# Patient Record
Sex: Female | Born: 1965 | Race: White | Hispanic: No | Marital: Married | Smoking: Never smoker
Health system: Southern US, Community
[De-identification: ages and names within clinical notes are randomized; demographics above are authoritative.]

## PROBLEM LIST (undated history)

## (undated) DIAGNOSIS — M1990 Unspecified osteoarthritis, unspecified site: Secondary | ICD-10-CM

## (undated) DIAGNOSIS — G4700 Insomnia, unspecified: Secondary | ICD-10-CM

## (undated) DIAGNOSIS — D259 Leiomyoma of uterus, unspecified: Secondary | ICD-10-CM

## (undated) DIAGNOSIS — D751 Secondary polycythemia: Secondary | ICD-10-CM

## (undated) DIAGNOSIS — J302 Other seasonal allergic rhinitis: Secondary | ICD-10-CM

## (undated) DIAGNOSIS — Z973 Presence of spectacles and contact lenses: Secondary | ICD-10-CM

## (undated) DIAGNOSIS — N950 Postmenopausal bleeding: Secondary | ICD-10-CM

## (undated) HISTORY — PX: HYSTEROSCOPY W/ ENDOMETRIAL ABLATION: SUR665

## (undated) HISTORY — PX: COLONOSCOPY: SHX174

---

## 1998-08-10 ENCOUNTER — Other Ambulatory Visit: Admission: RE | Admit: 1998-08-10 | Discharge: 1998-08-10 | Payer: Self-pay | Admitting: Obstetrics and Gynecology

## 2001-01-21 ENCOUNTER — Other Ambulatory Visit: Admission: RE | Admit: 2001-01-21 | Discharge: 2001-01-21 | Payer: Self-pay | Admitting: Obstetrics and Gynecology

## 2002-09-22 ENCOUNTER — Other Ambulatory Visit: Admission: RE | Admit: 2002-09-22 | Discharge: 2002-09-22 | Payer: Self-pay | Admitting: Obstetrics and Gynecology

## 2005-06-26 ENCOUNTER — Other Ambulatory Visit: Admission: RE | Admit: 2005-06-26 | Discharge: 2005-06-26 | Payer: Self-pay | Admitting: Obstetrics and Gynecology

## 2005-12-02 ENCOUNTER — Ambulatory Visit: Payer: Self-pay | Admitting: Internal Medicine

## 2007-05-27 ENCOUNTER — Ambulatory Visit (HOSPITAL_COMMUNITY): Admission: RE | Admit: 2007-05-27 | Discharge: 2007-05-27 | Payer: Self-pay | Admitting: Obstetrics and Gynecology

## 2007-05-27 ENCOUNTER — Encounter (INDEPENDENT_AMBULATORY_CARE_PROVIDER_SITE_OTHER): Payer: Self-pay | Admitting: Obstetrics and Gynecology

## 2007-08-13 DIAGNOSIS — J309 Allergic rhinitis, unspecified: Secondary | ICD-10-CM | POA: Insufficient documentation

## 2007-08-30 ENCOUNTER — Telehealth: Payer: Self-pay | Admitting: Internal Medicine

## 2008-02-07 ENCOUNTER — Telehealth: Payer: Self-pay | Admitting: Internal Medicine

## 2008-02-09 ENCOUNTER — Encounter: Payer: Self-pay | Admitting: Internal Medicine

## 2008-12-27 ENCOUNTER — Telehealth: Payer: Self-pay | Admitting: Internal Medicine

## 2009-06-15 ENCOUNTER — Telehealth: Payer: Self-pay | Admitting: Internal Medicine

## 2009-07-05 ENCOUNTER — Telehealth: Payer: Self-pay | Admitting: Internal Medicine

## 2009-10-03 ENCOUNTER — Ambulatory Visit: Payer: Self-pay | Admitting: Family Medicine

## 2009-10-03 DIAGNOSIS — N390 Urinary tract infection, site not specified: Secondary | ICD-10-CM | POA: Insufficient documentation

## 2009-10-03 LAB — CONVERTED CEMR LAB
Bilirubin Urine: NEGATIVE
Nitrite: NEGATIVE
Urobilinogen, UA: 0.2

## 2009-10-05 ENCOUNTER — Telehealth: Payer: Self-pay | Admitting: Family Medicine

## 2009-12-11 ENCOUNTER — Ambulatory Visit: Payer: Self-pay | Admitting: Internal Medicine

## 2009-12-28 ENCOUNTER — Telehealth: Payer: Self-pay | Admitting: Internal Medicine

## 2010-01-04 ENCOUNTER — Telehealth: Payer: Self-pay | Admitting: Internal Medicine

## 2010-01-15 ENCOUNTER — Encounter: Payer: Self-pay | Admitting: Internal Medicine

## 2010-01-17 ENCOUNTER — Encounter: Payer: Self-pay | Admitting: Internal Medicine

## 2010-01-18 ENCOUNTER — Telehealth: Payer: Self-pay | Admitting: Internal Medicine

## 2010-01-21 ENCOUNTER — Encounter: Payer: Self-pay | Admitting: Internal Medicine

## 2010-01-25 ENCOUNTER — Telehealth: Payer: Self-pay | Admitting: Internal Medicine

## 2010-02-01 ENCOUNTER — Telehealth: Payer: Self-pay

## 2010-02-05 ENCOUNTER — Encounter: Payer: Self-pay | Admitting: Internal Medicine

## 2010-03-25 ENCOUNTER — Telehealth (INDEPENDENT_AMBULATORY_CARE_PROVIDER_SITE_OTHER): Payer: Self-pay | Admitting: *Deleted

## 2010-08-12 ENCOUNTER — Telehealth: Payer: Self-pay | Admitting: Internal Medicine

## 2010-08-13 ENCOUNTER — Telehealth: Payer: Self-pay | Admitting: Internal Medicine

## 2010-10-30 ENCOUNTER — Telehealth: Payer: Self-pay | Admitting: Internal Medicine

## 2010-12-10 ENCOUNTER — Encounter: Payer: Self-pay | Admitting: Internal Medicine

## 2010-12-24 NOTE — Progress Notes (Signed)
  Phone Note From Pharmacy   [id number] Call For: k  Summary of Call: usual dose for Tamiflu is #10 instead of #20??? [phone] [facility] Initial call taken by: Duard Brady LPN,  January 04, 2010 1:10 PM  Follow-up for Phone Call        #10 OK if just for one patient  Follow-up by: Gordy Savers  MD,  January 04, 2010 2:30 PM    Additional Follow-up for Phone Call Additional follow up Details #2::    spoke with Laverta Baltimore , gave instruction to disp. # 10 . Follow-up by: Duard Brady LPN,  January 04, 2010 3:28 PM

## 2010-12-24 NOTE — Assessment & Plan Note (Signed)
Summary: fup on meds//ccm   Vital Signs:  Patient profile:   45 year old female Weight:      188 pounds BP sitting:   130 / 88  (left arm) Cuff size:   regular  Vitals Entered By: Raechel Ache, RN (December 11, 2009 4:10 PM) CC: Med check.   CC:  Med check..  History of Present Illness: 45 year old patient who is seen today for follow-up.  She has a history of allergic rhinitis.  She was seen two months ago for a UTI and has recovered.  She has been followed by dermatology for a scalp dermatitis, which has improved on oral prednisone, as well as topical steroids.  She feels well today without concerns or complaints.  Allergies: No Known Drug Allergies  Past History:  Past Medical History: Reviewed history from 08/13/2007 and no changes required. Allergic rhinitis  Physical Exam  General:  overweight-appearing.  normal blood pressure Head:  Normocephalic and atraumatic without obvious abnormalities. No apparent alopecia or balding. Eyes:  No corneal or conjunctival inflammation noted. EOMI. Perrla. Funduscopic exam benign, without hemorrhages, exudates or papilledema. Vision grossly normal. Ears:  External ear exam shows no significant lesions or deformities.  Otoscopic examination reveals clear canals, tympanic membranes are intact bilaterally without bulging, retraction, inflammation or discharge. Hearing is grossly normal bilaterally. Mouth:  Oral mucosa and oropharynx without lesions or exudates.  Teeth in good repair. Neck:  No deformities, masses, or tenderness noted. Lungs:  Normal respiratory effort, chest expands symmetrically. Lungs are clear to auscultation, no crackles or wheezes. Heart:  Normal rate and regular rhythm. S1 and S2 normal without gallop, murmur, click, rub or other extra sounds. Abdomen:  Bowel sounds positive,abdomen soft and non-tender without masses, organomegaly or hernias noted.   Impression & Recommendations:  Problem # 1:  ALLERGIC RHINITIS  (ICD-477.9)  Her updated medication list for this problem includes:    Nasonex 50 Mcg/act Susp (Mometasone furoate) .Marland Kitchen... As directed  Problem # 2:  UTI (ICD-599.0)  Her updated medication list for this problem includes:    Nitrofurantoin Macrocrystal 100 Mg Caps (Nitrofurantoin macrocrystal) ..... One by mouth two times a day for 3 days    Ciprofloxacin Hcl 250 Mg Tabs (Ciprofloxacin hcl) ..... One by mouth two times a day for 5 days  Complete Medication List: 1)  Nasonex 50 Mcg/act Susp (Mometasone furoate) .... As directed 2)  Lexapro 10 Mg Tabs (Escitalopram oxalate) .... Take 1 tablet by mouth once a day 3)  [name] 3 Mg Tabs (Eszopiclone) .... As needed 4)  Nitrofurantoin Macrocrystal 100 Mg Caps (Nitrofurantoin macrocrystal) .... One by mouth two times a day for 3 days 5)  Ciprofloxacin Hcl 250 Mg Tabs (Ciprofloxacin hcl) .... One by mouth two times a day for 5 days  Patient Instructions: 1)  Please schedule a follow-up appointment as needed. 2)  It is important that you exercise regularly at least 20 minutes 5 times a week. If you develop chest pain, have severe difficulty breathing, or feel very tired , stop exercising immediately and seek medical attention. 3)  You need to lose weight. Consider a lower calorie diet and regular exercise.  Prescriptions: [name] 3 MG  TABS (ESZOPICLONE) as needed  #90 x 3   Entered and Authorized by:   Gordy Savers  MD   Signed by:   Gordy Savers  MD on 12/11/2009   Method used:   Print then Give to Patient   RxID:   [id number] [id number]  50 MCG/ACT  SUSP (MOMETASONE FUROATE) as directed  #3 x 5   Entered and Authorized by:   Gordy Savers  MD   Signed by:   Gordy Savers  MD on 12/11/2009   Method used:   Print then Give to Patient   RxID:   [id number]

## 2010-12-24 NOTE — Progress Notes (Signed)
Summary: tamiflu  Call back at [phone]   Summary of Call: Son diagnosed with flu B & handout they gave recommends she get Tamiflu to use.  She & he had the flu shot.  [facility]  NKDA. Initial call taken by: Rudy Jew, RN,  January 04, 2010 11:52 AM    New/Updated Medications: TAMIFLU 75 MG CAPS (OSELTAMIVIR PHOSPHATE) One bid Prescriptions: TAMIFLU 75 MG CAPS (OSELTAMIVIR PHOSPHATE) One bid  #20 x 0   Entered by:   Rudy Jew, RN   Authorized by:   Gordy Savers  MD   Signed by:   Rudy Jew, RN on 01/04/2010   Method used:   Electronically to        [facility] [phone]* (retail)       [address]       [place], [place]  [address]       Ph: [phone] or [phone]       Fax: [phone]   RxID:   [id number]  Tamiflu 75  #20 one twice daily

## 2010-12-24 NOTE — Miscellaneous (Signed)
Summary: lunesta mg change  Clinical Lists Changes  Medications: Changed medication from [name] 3 MG  TABS (ESZOPICLONE) as needed to * [name] 2 MG  TABS (ESZOPICLONE) as needed

## 2010-12-24 NOTE — Medication Information (Signed)
Summary: Coverage Approval for [name]  Coverage Approval for [name]   Imported By: Maryln Gottron 02/06/2010 15:25:56  _____________________________________________________________________  External Attachment:    Type:   Image     Comment:   External Document

## 2010-12-24 NOTE — Progress Notes (Signed)
Summary: decrease to 2 mg  Phone Note Call from Patient   Caller: Patient Call For: Gordy Savers  MD Summary of Call: pt would like to decrease to lunesta 2 mg [facility] Initial call taken by: Heron Sabins,  October 30, 2010 12:03 PM  Follow-up for Phone Call        ok  Follow-up by: Gordy Savers  MD,  October 30, 2010 12:41 PM  Additional Follow-up for Phone Call Additional follow up Details #1::        change to med list - [facility] - [facility]'d 3mg  and gave new rx for 2mg . KIK Additional Follow-up by: Duard Brady LPN,  October 31, 2010 11:59 AM    New/Updated Medications: [name] 2 MG TABS (ESZOPICLONE) one at bedtime as needed for sleep Prescriptions: [name] 2 MG TABS (ESZOPICLONE) one at bedtime as needed for sleep  #30 x 1   Entered by:   Duard Brady LPN   Authorized by:   Gordy Savers  MD   Signed by:   Duard Brady LPN on 11/26/7251   Method used:   Historical   RxID:   [id number]

## 2010-12-24 NOTE — Progress Notes (Signed)
Summary: Call-A-Nurse Report    Call-A-Nurse Triage Call Report Triage Record Num: [id number] Operator: Jaci Carrel Patient Name: Robin Bowman Call Date & Time: 03/23/2010 1:26:39PM Patient Phone: [phone] PCP: Gordy Savers Patient Gender: Female PCP Fax : [phone] Patient DOB: 10-18-1966 Practice Name: [facility] Reason for Call: LMP- 4.30. Pt states that she thinks she has UTI. Afebrile. Onset 4.30- sx: burning, frequency, urgency. Pt states that she does have some back pain- but this is normal for her and her cycle just started. Advised UC. Pt will go for eval. Protocol(s) Used: Urinary Symptoms - Female Recommended Outcome per Protocol: See Provider within 24 hours Reason for Outcome: Urinary tract symptoms Care Advice:  ~ 03/23/2010 1:38:47PM Page 1 of 1 CAN_[id number]_V2

## 2010-12-24 NOTE — Progress Notes (Signed)
Summary: lunesta 2 mg has been approved/pt stated she needs 3mg    Phone Note Call from Patient Call back at Home Phone [phone]   Caller: Patient Call For: Gordy Savers  MD Complaint: Urinary/GYN Problems Action Taken: Patient advised to call 911 Summary of Call: pt stated she has been on lunesta 3 mg not 2 mg, Prior Authorization has been approved for 2 mg. Pt has rx for 3 mg. Please advise  Initial call taken by: Heron Sabins,  January 25, 2010 9:49 AM  Follow-up for Phone Call        OK to try 2 mg or can change dose  to 3 mg Follow-up by: Gordy Savers  MD,  January 25, 2010 12:48 PM  Additional Follow-up for Phone Call Additional follow up Details #1::        please do pre auth for 3 mg [name]  Additional Follow-up by: Duard Brady LPN,  January 25, 2010 1:30 PM    Additional Follow-up for Phone Call Additional follow up Details #2::    Ppt called again...checking on stauts. Please return her call today. Pt needs to call insurance company and let them send a new form.  Or, since her present prescription is for 2 mg, she will most likely need a new prescription from Dr. Kirtland Bouchard with new strength and directions to send to mail order. Lynann Beaver CMA  January 25, 2010 2:25 PM I left a message, but we will need the prescription. Follow-up by: Warnell Forester,  January 25, 2010 2:21 PM  New/Updated Medications: * [name] 3 MG  TABS (ESZOPICLONE) as needed Prescriptions: [name] 3 MG  TABS (ESZOPICLONE) as needed  #90 x 2   Entered by:   Duard Brady LPN   Authorized by:   Gordy Savers  MD   Signed by:   Duard Brady LPN on 13/24/4010   Method used:   Printed then faxed to ...       [facility]* (mail-order)             ,          Ph: [phone]       Fax: [phone]   RxID:   [id number]   Appended Document: lunesta 2 mg has been approved/pt stated she needs 3mg   spoke with Joe at [facility] call center - per him - we do not  need to do another PA - just needs new rx for 3mg  - I attempt to call pt to make them aware - cell # - voice mail - LMTCB if questions , informed of above.

## 2010-12-24 NOTE — Progress Notes (Signed)
Summary: MEDICATION RX TO [facility]?  Phone Note Call from Patient   Caller: Patient [phone] Reason for Call: Talk to Nurse, Talk to Doctor Summary of Call: Pt called and adv that she needs to have RX for her meds ([name] 3 MG  TABS (ESZOPICLONE)   -and-  (NASONEX 50 MCG/ACT  SUSP (MOMETASONE FUROATE)  X 90 days  to be faxed into her mail order pharmacy [facility])..... Pt states that she received a copy of her written RX but would rather they be faxed to [facility] fax # [phone].  instead of her having to mail them.  Pt can be reached @ [phone] with any questions or concerns.  Initial call taken by: Debbra Riding,  December 28, 2009 10:18 AM    Prescriptions: [name] 3 MG  TABS (ESZOPICLONE) as needed  #90 x 3   Entered by:   Raechel Ache, RN   Authorized by:   Gordy Savers  MD   Signed by:   Raechel Ache, RN on 12/28/2009   Method used:   Printed then faxed to ...       [facility]* (mail-order)             ,          Ph: [phone]       Fax: [phone]   RxID:   [id number] NASONEX 50 MCG/ACT  SUSP (MOMETASONE FUROATE) as directed  #3 x 5   Entered by:   Raechel Ache, RN   Authorized by:   Gordy Savers  MD   Signed by:   Raechel Ache, RN on 12/28/2009   Method used:   Printed then faxed to ...       [facility]* (mail-order)             ,          Ph: [phone]       Fax: [phone]   RxID:   [id number]

## 2010-12-24 NOTE — Progress Notes (Signed)
Summary: please return call - ps on lunesta 3 mg done - rx faxed  Phone Note Call from Patient Call back at Work Phone [phone]   Caller: Patient--live call Reason for Call: Talk to Nurse Summary of Call: Wants Kim to return call. Initial call taken by: Warnell Forester,  February 01, 2010 8:52 AM    Prescriptions: [name] 3 MG  TABS (ESZOPICLONE) as needed  #14 x 1   Entered by:   Duard Brady LPN   Authorized by:   Gordy Savers  MD   Signed by:   Duard Brady LPN on 09/81/1914   Method used:   F[facility]ed to ...       [facility] [phone]* (retail)       [address]       [place], [place]  [address]       Ph: [phone] or [phone]       Fax: [phone]   RxID:   [id number]  spoke with Riki Rusk at [facility] [phone] - did pre auth for [name] 3mg  , approved 02/01/10 to 02/02/11 , no auth # given , when filled pharm will see that it has been approved. Pt. aware. lunesta 3 mg [facility] and to [facility]

## 2010-12-24 NOTE — Progress Notes (Signed)
Summary: Pt req refill of [name] 3mg  to [facility] [address]   Phone Note Refill Request Call back at Work Phone [phone] Message from:  Patient on August 12, 2010 11:52 AM  Refills Requested: Medication #1:  [name] 3 MG  TABS (ESZOPICLONE) as needed   Dosage confirmed as above?Dosage Confirmed   Brand Name Necessary? No Pls call in script to [facility] [address]            Method Requested: Telephone to Pharmacy Initial call taken by: Lucy Antigua,  August 12, 2010 11:53 AM    Prescriptions: Alfonso Patten 3 MG  TABS (ESZOPICLONE) as needed  #30 x 1   Entered by:   Duard Brady LPN   Authorized by:   Gordy Savers  MD   Signed by:   Duard Brady LPN on 86/57/8469   Method used:   Historical   RxID:   [id number]

## 2010-12-24 NOTE — Progress Notes (Signed)
Summary: needs rx-faxed 2wk supply  Phone Note Call from Patient Call back at Work Phone [phone]   Caller: Patient-live call Summary of Call: need a 2 week suplly of [name] until her mail order pharmacy arrives. [facility] Call pt with the status. Initial call taken by: Warnell Forester,  January 18, 2010 12:46 PM    Prescriptions: [name] 2 MG  TABS (ESZOPICLONE) as needed  #14 x 1   Entered by:   Duard Brady LPN   Authorized by:   Gordy Savers  MD   Signed by:   Duard Brady LPN on 13/06/6577   Method used:   Faxed to ...       [facility] [address] (retail)       [address]       [place], [place]  [address]       Ph: [phone] or [phone]       Fax: [phone]   RxID:   [id number]

## 2010-12-24 NOTE — Medication Information (Signed)
Summary: [name] Approved/BCBS  [name] Approved/BCBS   Imported By: Sherian Rein 01/24/2010 12:03:13  _____________________________________________________________________  External Attachment:    Type:   Image     Comment:   External Document

## 2010-12-24 NOTE — Progress Notes (Signed)
Summary: refill lunesta - [facility]  Phone Note Refill Request Message from:  Fax from Pharmacy on August 13, 2010 10:28 AM  Refills Requested: Medication #1:  [name] 3 MG  TABS (ESZOPICLONE) as needed [facility]    Method Requested: Fax to Local Pharmacy Initial call taken by: Duard Brady LPN,  August 13, 2010 10:28 AM    Prescriptions: [name] 3 MG  TABS (ESZOPICLONE) as needed  #30 x 6   Entered by:   Duard Brady LPN   Authorized by:   Gordy Savers  MD   Signed by:   Duard Brady LPN on 29/56/2130   Method used:   Historical   RxID:   [id number]  faxed to [facility]. - also had [facility] with 1 refill yesterday     KIK

## 2010-12-24 NOTE — Medication Information (Signed)
Summary: Prior Authorization Request for [facility]  Prior Authorization Request for [name]   Imported By: Maryln Gottron 01/22/2010 09:24:22  _____________________________________________________________________  External Attachment:    Type:   Image     Comment:   External Document

## 2010-12-26 NOTE — Medication Information (Signed)
Summary: Alfonso Patten Approved  [name] Approved   Imported By: Maryln Gottron 12/12/2010 13:29:05  _____________________________________________________________________  External Attachment:    Type:   Image     Comment:   External Document

## 2010-12-31 ENCOUNTER — Other Ambulatory Visit: Payer: Self-pay | Admitting: Internal Medicine

## 2010-12-31 DIAGNOSIS — G4700 Insomnia, unspecified: Secondary | ICD-10-CM

## 2010-12-31 MED ORDER — ESZOPICLONE 2 MG PO TABS: 2.0000 mg | ORAL_TABLET | Freq: Every evening | ORAL | Status: DC | PRN

## 2010-12-31 NOTE — Telephone Encounter (Signed)
[facility] 

## 2011-02-21 ENCOUNTER — Other Ambulatory Visit: Payer: Self-pay | Admitting: Internal Medicine

## 2011-02-21 MED ORDER — MOMETASONE FUROATE 50 MCG/ACT NA SUSP: 2.0000 | Freq: Every day | NASAL | Status: DC

## 2011-02-21 NOTE — Telephone Encounter (Signed)
Pt called and said she normally get Nasonex Spray through mail order pharmacy, but she would like this med to [facility] called [facility] [address] asap.

## 2011-03-31 ENCOUNTER — Other Ambulatory Visit: Payer: Self-pay

## 2011-03-31 DIAGNOSIS — G4700 Insomnia, unspecified: Secondary | ICD-10-CM

## 2011-03-31 MED ORDER — ESZOPICLONE 2 MG PO TABS: 2.0000 mg | ORAL_TABLET | Freq: Every evening | ORAL | Status: DC | PRN

## 2011-04-08 NOTE — Op Note (Signed)
Robin, Bowman                 ACCOUNT NO.:  [id number]   MEDICAL RECORD NO.:  [id number]          PATIENT TYPE:  AMB   LOCATION:  SDC                           FACILITY:  [facility]   PHYSICIAN:  Miguel Aschoff, M.D.       DATE OF BIRTH:  06/02/1966   DATE OF PROCEDURE:  05/27/2007  DATE OF DISCHARGE:                               OPERATIVE REPORT   PREOPERATIVE DIAGNOSIS:  Menorrhagia.   POSTOPERATIVE DIAGNOSIS:  Menorrhagia.   PROCEDURE:  Cervical dilatation, hysteroscopy, uterine curettage,  NovaSure endometrial ablation.   SURGEON:  Dr. Miguel Aschoff   ANESTHESIA:  General.   COMPLICATIONS:  None.   JUSTIFICATION:  The patient is a 45 year old white female with history  of progressive menorrhagia not controlled with medical therapy.  Because  of degree of bleeding.  She presents now to undergo D&C hysteroscopy and  endometrial ablation effort to both diagnose and control the heavy  menses.  The risks and benefits of this procedure were discussed with  the patient and informed consent has been obtained.   PROCEDURE:  The patient was taken to the operating placed in the supine  position.  General anesthesia was administered without difficulty.  Placed in dorsal lithotomy position, prepped and draped in usual sterile  fashion.  Bladder was catheterized.  Under anesthesia examination was  carried out.  This revealed normal external genitalia normal Bartholin's  and Skene's glands and normal urethra. Vaginal wall was without gross  lesion.  The cervix was without gross lesion.  The uterus to be  globular, top normal size.  No adnexal masses were noted. At this point  the uterus was sounded 8 cm and cervical length of 3.5 cm was determined  for cavity length of 4.5 cm. After this was done cervix was further  dilated and the diagnostic hysteroscope was advanced through the  endocervical canal into the endometrial cavity.  Inspection did not  reveal any evidence of submucous myomas. The  endocervix was without any  lesions.  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 Following this, sharp  vigorous curettage was carried out with a medium-size serrated curette.  This tissue was sent for histologic study.  Once this was completed the  NovaSure endometrial ablation unit was passed into the uterus.  A cavity  width of 2.6 cm was determined and then a treatment cycle at 64 watts  for 1 minute 25 seconds was carried out without difficulty.  On  completion of the treatment the hysteroscope was reintroduced.  There  appeared be good coagulation within the cavity. At this point all  instruments were removed.  The paracervical block in place using 18 mL  of 1% Xylocaine by placing equal amounts at the 12, 4, and 8 o'clock  positions on the cervix.  Estimated blood loss from the procedure was  approximately 40 mL.  The patient tolerated the procedure well and  went to the recovery room in satisfactory condition.  Plan is for the  patient to be discharged home.  Medications for home include Darvocet N  100 one every 4 hours as needed for pain, doxycycline one twice a day x3  days.  The patient is to call for any problems such as fever, pain or  heavy bleeding.  She is to call for follow-up visit in 4 weeks.      Miguel Aschoff, M.D.  Electronically Signed     AR/MEDQ  D:  05/27/2007  T:  05/27/2007  Job:  [id number]

## 2011-04-30 ENCOUNTER — Other Ambulatory Visit: Payer: Self-pay | Admitting: Obstetrics and Gynecology

## 2011-05-02 ENCOUNTER — Telehealth: Payer: Self-pay

## 2011-05-02 NOTE — Telephone Encounter (Signed)
Incoming fax from [facility] on [address] for lunesta 2mg - denied pt has not been seen since 3.2011.

## 2011-05-08 ENCOUNTER — Other Ambulatory Visit: Payer: Self-pay | Admitting: Internal Medicine

## 2011-05-08 DIAGNOSIS — G4700 Insomnia, unspecified: Secondary | ICD-10-CM

## 2011-05-08 NOTE — Telephone Encounter (Signed)
Pt called and said that [facility], sent a refill over re: pts [name] 2 mg last wk, and have not rcvd a response. Pls call in [name] asap today. Pt out of med.

## 2011-05-09 ENCOUNTER — Other Ambulatory Visit: Payer: Self-pay | Admitting: Internal Medicine

## 2011-05-09 MED ORDER — ESZOPICLONE 2 MG PO TABS: 2.0000 mg | ORAL_TABLET | Freq: Every evening | ORAL | Status: DC | PRN

## 2011-05-09 NOTE — Telephone Encounter (Signed)
Pt is aware Robin Bowman has been denied due to last ov 01-2010

## 2011-05-09 NOTE — Telephone Encounter (Signed)
Pt has not been seen since 11/2009 - must be seen - will rx #15 ONLY WITH NO REFILLS UNTIL SEEN.

## 2011-09-09 LAB — CBC
MCHC: 33
MCV: 79
Platelets: 326
RDW: 14.2 — ABNORMAL HIGH

## 2011-09-09 LAB — PREGNANCY, URINE: Preg Test, Ur: NEGATIVE

## 2012-02-17 ENCOUNTER — Encounter: Payer: Self-pay | Admitting: Internal Medicine

## 2012-02-17 ENCOUNTER — Ambulatory Visit (INDEPENDENT_AMBULATORY_CARE_PROVIDER_SITE_OTHER): Payer: BC Managed Care – PPO | Admitting: Internal Medicine

## 2012-02-17 VITALS — BP 120/100 | Temp 98.1°F | Wt 191.0 lb

## 2012-02-17 DIAGNOSIS — J309 Allergic rhinitis, unspecified: Secondary | ICD-10-CM

## 2012-02-17 DIAGNOSIS — J069 Acute upper respiratory infection, unspecified: Secondary | ICD-10-CM

## 2012-02-17 MED ORDER — MOMETASONE FUROATE 50 MCG/ACT NA SUSP: 2.0000 | Freq: Every day | NASAL | Status: DC

## 2012-02-17 MED ORDER — FEXOFENADINE HCL 180 MG PO TABS: 180.0000 mg | ORAL_TABLET | Freq: Every day | ORAL | Status: DC

## 2012-02-17 NOTE — Progress Notes (Signed)
  Subjective:    Patient ID: Robin Bowman, female    DOB: 03/28/1966, 46 y.o.   MRN: [id number]  HPI 46 year old patient who presents with a five-day history of hoarseness. She's had minimal cough mild sore throat But no fever. Cough is mild and nonproductive. She does have a history of allergic rhinitis and does take Allegra and Nasonex chronically  Review of Systems  HENT: Positive for voice change.   Respiratory: Positive for cough.        Objective:   Physical Exam  Constitutional: She is oriented to person, place, and time. She appears well-developed and well-nourished.  HENT:  Head: Normocephalic.  Right Ear: External ear normal.  Left Ear: External ear normal.  Mouth/Throat: Oropharynx is clear and moist.  Eyes: Conjunctivae and EOM are normal. Pupils are equal, round, and reactive to light.  Neck: Normal range of motion. Neck supple. No thyromegaly present.  Cardiovascular: Normal rate, regular rhythm, normal heart sounds and intact distal pulses.   Pulmonary/Chest: Effort normal and breath sounds normal.  Abdominal: Soft. Bowel sounds are normal. She exhibits no mass. There is no tenderness.  Musculoskeletal: Normal range of motion.  Lymphadenopathy:    She has no cervical adenopathy.  Neurological: She is alert and oriented to person, place, and time.  Skin: Skin is warm and dry. No rash noted.  Psychiatric: She has a normal mood and affect. Her behavior is normal.          Assessment & Plan:  Viral URI with laryngitis  We'll continue all present medications. Will add an anti-inflammatory medication

## 2012-02-17 NOTE — Patient Instructions (Signed)
Get plenty of rest, Drink lots of  clear liquids, and use Tylenol or ibuprofen for fever and discomfort.    

## 2012-02-18 ENCOUNTER — Ambulatory Visit: Payer: Self-pay | Admitting: Family Medicine

## 2012-02-23 ENCOUNTER — Telehealth: Payer: Self-pay | Admitting: Family Medicine

## 2012-02-23 NOTE — Telephone Encounter (Signed)
[facility] Triage Call Report Triage Record Num: [id number] Operator: Bennie Hind Patient Name: Robin Bowman Call Date & Time: 02/20/2012 3:43:09PM Patient Phone: [phone] PCP: Gordy Savers Patient Gender: Female PCP Fax : [phone] Patient DOB: 30-May-1966 [facility] Name[facility] Reason for Call: Caller: [name]; PCP: [name]; CB#: [phone]; Call regarding Laryngitis, given samples of Celebrex but not better; 3-26 office for cough and hoarsness she states given samples of Celebrex to try. She is feeling some better but on last pill and would like more. I did look in EPIC and it said he was adding antiinflammatory but did not mention the Celebrex. All emergent sxs per Sore Throat or Hoarsness R/O. Home care advice given. Advised Motrin through w/e and is still having problems 4-1 contact office. Protocol(s) Used: Sore Throat or Hoarseness Recommended Outcome per Protocol: Provide Home/Self Care Reason for Outcome: Hoarseness Care Advice: ~ Use a cool mist humidifier to moisten air. Be sure to clean according to manufacturer's instructions. If hoarseness is the result of overuse of the voice, try resting the voice. Consider seeing a voice therapist to learn how to alter method of speech so that voice is not strained. ~ Call provider immediately if cough up blood, have difficulty swallowing, or swollen lymph glands in the neck or armpits. ~ ~ Anytime hoarseness is present, avoid talking and singing. ~ See a provider for hoarseness that does not resolve within 2 weeks. ~ SYMPTOM / CONDITION MANAGEMENT Most adults need to drink 6-10 eight-ounce glasses (1.2-2.0 liters) of fluids per day unless previously told to limit fluid intake for other medical reasons. Limit fluids that contain caffeine, sugar or alcohol. Urine will be a very light yellow color when you drink enough fluids. ~ Sore Throat Relief: - Use warm salt water gargles 3 to 4  times/day, as needed (1/2 tsp. salt in 8 oz. [.2 liters] water). - Suck on hard candy, nonprescription or herbal throat lozenges (sugar-free if diabetic) - Eat soothing, soft food/fluids (broths, soups, or honey and lemon juice in hot tea, Popsicles, frozen yogurt or sherbet, scrambled eggs, cooked cereals, Jell-O or puddings) whichever is most comforting. - Avoid eating salty, spicy or acidic foods. ~ Hoarseness Care: - Avoid anything that may dry the throat tissues including tobacco smoke. - Limit the use of caffeine, alcohol, and decongestants. - Avoid whispering when hoarse, as this puts more strain on the voice than talking in a normal voice. - Avoid clearing throat. - Try sucking on throat lozenges or cough drops to ease the tightness in the throat. ~

## 2012-04-07 ENCOUNTER — Other Ambulatory Visit: Payer: Self-pay

## 2012-04-07 MED ORDER — FEXOFENADINE-PSEUDOEPHED ER 180-240 MG PO TB24: 1.0000 | ORAL_TABLET | Freq: Every day | ORAL | Status: AC

## 2012-04-09 ENCOUNTER — Other Ambulatory Visit: Payer: Self-pay

## 2012-04-09 MED ORDER — LORATADINE-PSEUDOEPHEDRINE ER 10-240 MG PO TB24: 1.0000 | ORAL_TABLET | Freq: Every day | ORAL | Status: DC

## 2013-01-21 ENCOUNTER — Other Ambulatory Visit: Payer: Self-pay | Admitting: Physical Medicine and Rehabilitation

## 2013-01-21 DIAGNOSIS — M545 Low back pain, unspecified: Secondary | ICD-10-CM

## 2013-01-30 ENCOUNTER — Ambulatory Visit
Admission: RE | Admit: 2013-01-30 | Discharge: 2013-01-30 | Disposition: A | Discharge status: Home / Self Care | Payer: BC Managed Care – PPO | Source: Ambulatory Visit | Attending: Physical Medicine and Rehabilitation | Admitting: Physical Medicine and Rehabilitation

## 2013-01-30 DIAGNOSIS — M545 Low back pain, unspecified: Secondary | ICD-10-CM

## 2013-02-14 ENCOUNTER — Other Ambulatory Visit: Payer: Self-pay | Admitting: Internal Medicine

## 2013-02-15 ENCOUNTER — Other Ambulatory Visit: Payer: Self-pay | Admitting: Internal Medicine

## 2013-07-05 ENCOUNTER — Other Ambulatory Visit: Payer: Self-pay | Admitting: Physical Medicine and Rehabilitation

## 2013-07-05 DIAGNOSIS — IMO0002 Reserved for concepts with insufficient information to code with codable children: Secondary | ICD-10-CM

## 2013-07-09 ENCOUNTER — Ambulatory Visit
Admission: RE | Admit: 2013-07-09 | Discharge: 2013-07-09 | Disposition: A | Discharge status: Home / Self Care | Payer: Federal, State, Local not specified - PPO | Source: Ambulatory Visit | Attending: Physical Medicine and Rehabilitation | Admitting: Physical Medicine and Rehabilitation

## 2013-07-09 DIAGNOSIS — IMO0002 Reserved for concepts with insufficient information to code with codable children: Secondary | ICD-10-CM

## 2013-07-09 MED ORDER — GADOBENATE DIMEGLUMINE 529 MG/ML IV SOLN
17.0000 mL | Freq: Once | INTRAVENOUS | Status: AC | PRN
  Administered 2013-07-09: 17 mL via INTRAVENOUS

## 2013-07-22 ENCOUNTER — Other Ambulatory Visit: Payer: Self-pay | Admitting: Obstetrics & Gynecology

## 2015-02-20 DIAGNOSIS — E669 Obesity, unspecified: Secondary | ICD-10-CM | POA: Insufficient documentation

## 2015-09-12 DIAGNOSIS — K5790 Diverticulosis of intestine, part unspecified, without perforation or abscess without bleeding: Secondary | ICD-10-CM | POA: Insufficient documentation

## 2016-04-28 DIAGNOSIS — F5101 Primary insomnia: Secondary | ICD-10-CM | POA: Insufficient documentation

## 2016-08-08 DIAGNOSIS — M461 Sacroiliitis, not elsewhere classified: Secondary | ICD-10-CM | POA: Insufficient documentation

## 2016-10-10 ENCOUNTER — Other Ambulatory Visit: Payer: Self-pay | Admitting: Obstetrics

## 2016-10-13 LAB — CYTOLOGY - PAP

## 2016-10-20 ENCOUNTER — Other Ambulatory Visit: Payer: Self-pay | Admitting: Obstetrics

## 2016-10-20 DIAGNOSIS — R928 Other abnormal and inconclusive findings on diagnostic imaging of breast: Secondary | ICD-10-CM

## 2016-10-23 ENCOUNTER — Ambulatory Visit
Admission: RE | Admit: 2016-10-23 | Discharge: 2016-10-23 | Disposition: A | Discharge status: Home / Self Care | Payer: Federal, State, Local not specified - PPO | Source: Ambulatory Visit | Attending: Obstetrics | Admitting: Obstetrics

## 2016-10-23 DIAGNOSIS — R928 Other abnormal and inconclusive findings on diagnostic imaging of breast: Secondary | ICD-10-CM

## 2016-12-03 ENCOUNTER — Other Ambulatory Visit: Payer: Self-pay | Admitting: Sports Medicine

## 2016-12-03 DIAGNOSIS — M545 Low back pain: Secondary | ICD-10-CM

## 2016-12-18 ENCOUNTER — Ambulatory Visit
Admission: RE | Admit: 2016-12-18 | Discharge: 2016-12-18 | Disposition: A | Discharge status: Home / Self Care | Payer: Federal, State, Local not specified - PPO | Source: Ambulatory Visit | Attending: Sports Medicine | Admitting: Sports Medicine

## 2016-12-18 DIAGNOSIS — M545 Low back pain: Secondary | ICD-10-CM

## 2016-12-18 MED ORDER — GADOBENATE DIMEGLUMINE 529 MG/ML IV SOLN
18.0000 mL | Freq: Once | INTRAVENOUS | Status: AC | PRN
  Administered 2016-12-18: 18 mL via INTRAVENOUS

## 2017-01-30 ENCOUNTER — Other Ambulatory Visit: Payer: Self-pay | Admitting: Obstetrics

## 2018-07-30 DIAGNOSIS — D259 Leiomyoma of uterus, unspecified: Secondary | ICD-10-CM | POA: Insufficient documentation

## 2018-08-11 ENCOUNTER — Other Ambulatory Visit: Payer: Self-pay | Admitting: Obstetrics

## 2018-08-11 DIAGNOSIS — R928 Other abnormal and inconclusive findings on diagnostic imaging of breast: Secondary | ICD-10-CM

## 2018-08-13 ENCOUNTER — Ambulatory Visit
Admission: RE | Admit: 2018-08-13 | Discharge: 2018-08-13 | Disposition: A | Discharge status: Home / Self Care | Payer: Federal, State, Local not specified - PPO | Source: Ambulatory Visit | Attending: Obstetrics | Admitting: Obstetrics

## 2018-08-13 DIAGNOSIS — R928 Other abnormal and inconclusive findings on diagnostic imaging of breast: Secondary | ICD-10-CM

## 2018-08-16 ENCOUNTER — Other Ambulatory Visit: Payer: Federal, State, Local not specified - PPO

## 2018-12-31 DIAGNOSIS — D485 Neoplasm of uncertain behavior of skin: Secondary | ICD-10-CM | POA: Diagnosis not present

## 2018-12-31 DIAGNOSIS — D2261 Melanocytic nevi of right upper limb, including shoulder: Secondary | ICD-10-CM | POA: Diagnosis not present

## 2018-12-31 DIAGNOSIS — D2262 Melanocytic nevi of left upper limb, including shoulder: Secondary | ICD-10-CM | POA: Diagnosis not present

## 2018-12-31 DIAGNOSIS — D225 Melanocytic nevi of trunk: Secondary | ICD-10-CM | POA: Diagnosis not present

## 2018-12-31 DIAGNOSIS — D1801 Hemangioma of skin and subcutaneous tissue: Secondary | ICD-10-CM | POA: Diagnosis not present

## 2019-02-14 DIAGNOSIS — J302 Other seasonal allergic rhinitis: Secondary | ICD-10-CM | POA: Diagnosis not present

## 2019-02-14 DIAGNOSIS — G4700 Insomnia, unspecified: Secondary | ICD-10-CM | POA: Diagnosis not present

## 2019-05-03 DIAGNOSIS — L918 Other hypertrophic disorders of the skin: Secondary | ICD-10-CM | POA: Diagnosis not present

## 2019-06-25 DIAGNOSIS — Z20828 Contact with and (suspected) exposure to other viral communicable diseases: Secondary | ICD-10-CM | POA: Diagnosis not present

## 2019-09-20 IMAGING — US ULTRASOUND RIGHT BREAST LIMITED
1 series · 5 of 5 positions shown · non-contrast
Comparison: Previous exam(s).

CLINICAL DATA: Screening recall for a possible right breast
mass.Patient is on an estrogen patch.

EXAM:
DIGITAL DIAGNOSTIC RIGHT MAMMOGRAM WITH CAD AND TOMO
ULTRASOUND RIGHT BREAST

[Series 1: ultrasound right breast limited · 0.07mm/px · 5 of 5 slices shown]
[im 1/5]
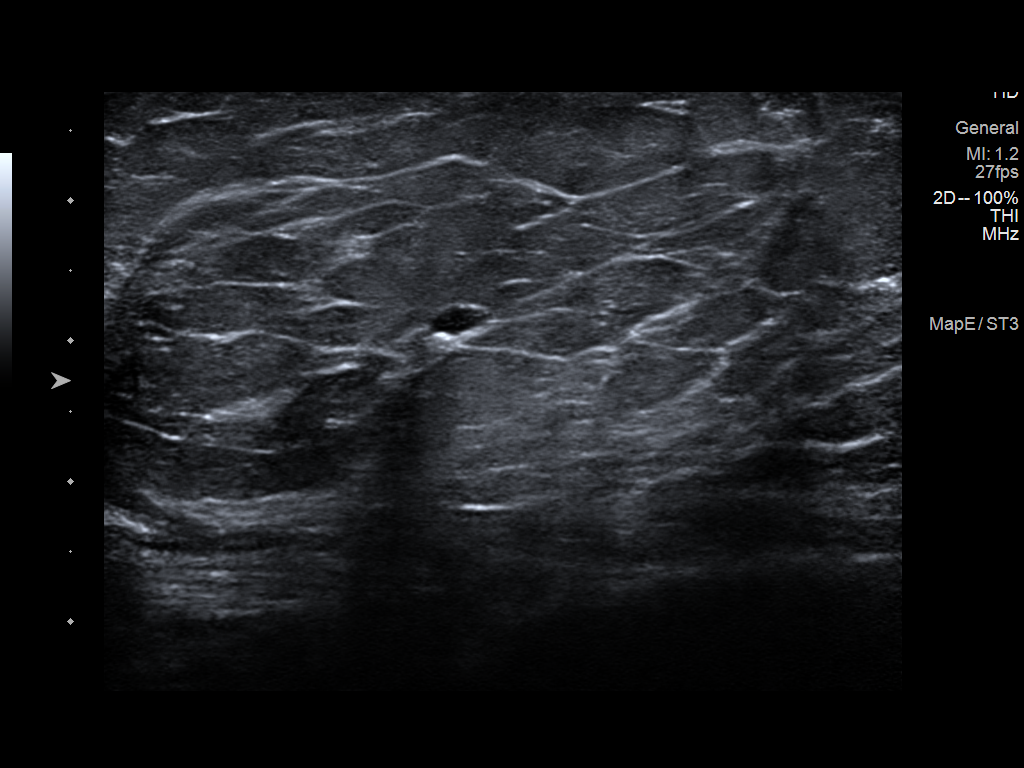
[im 2/5]
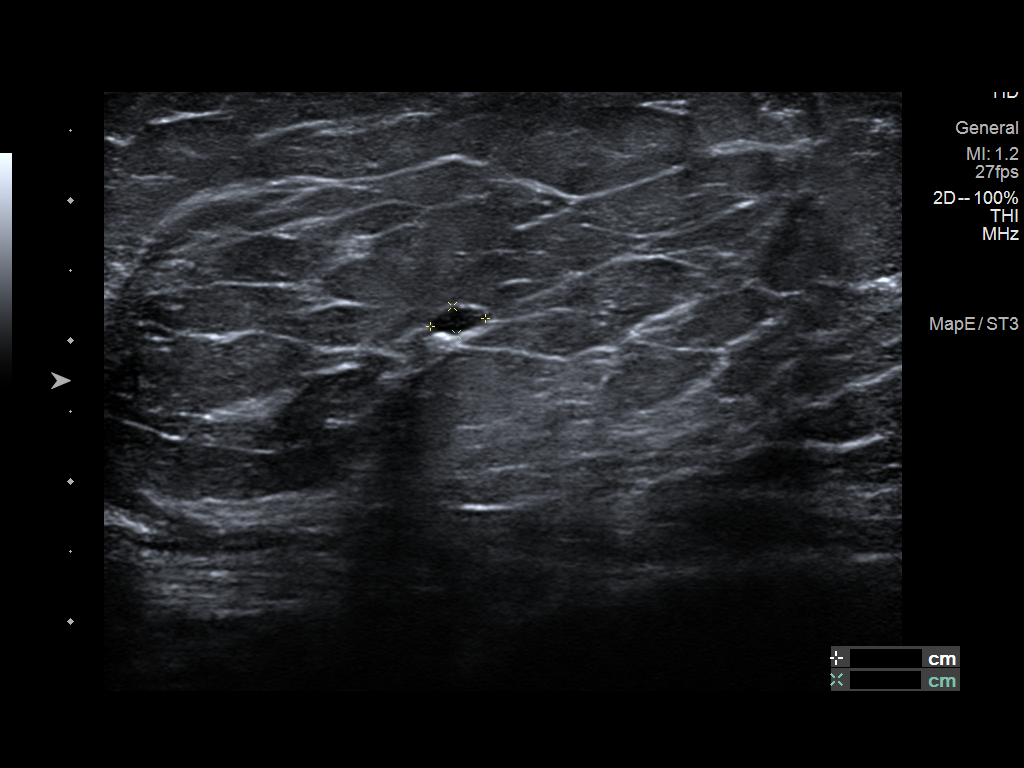
[im 3/5]
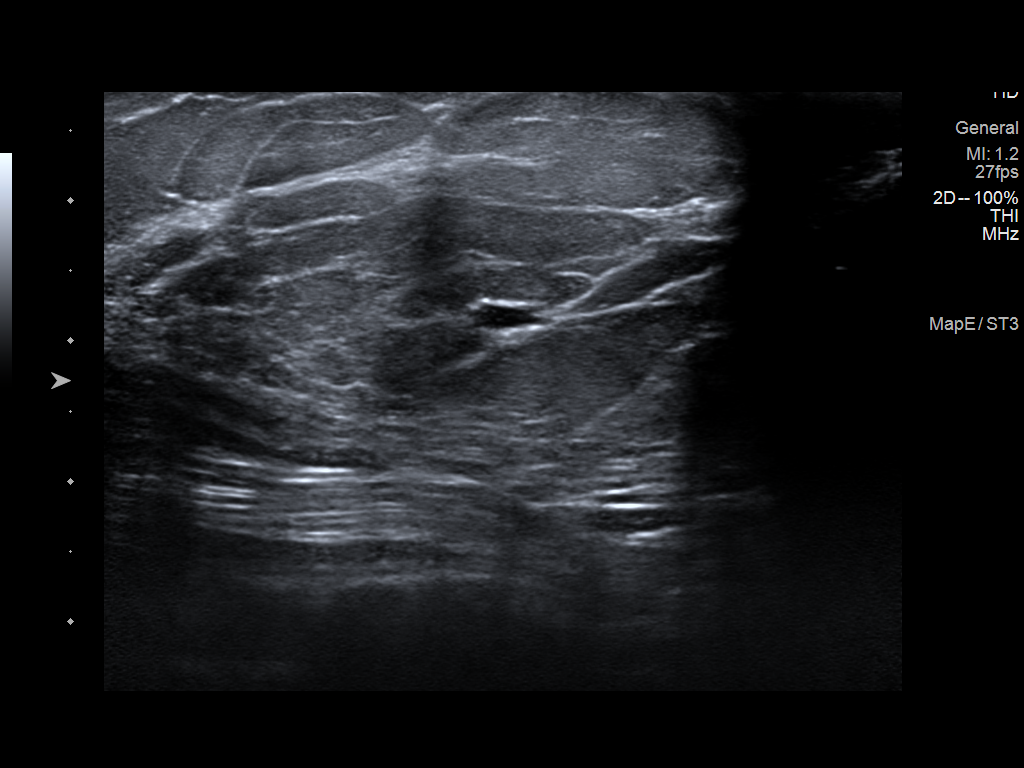
[im 4/5]
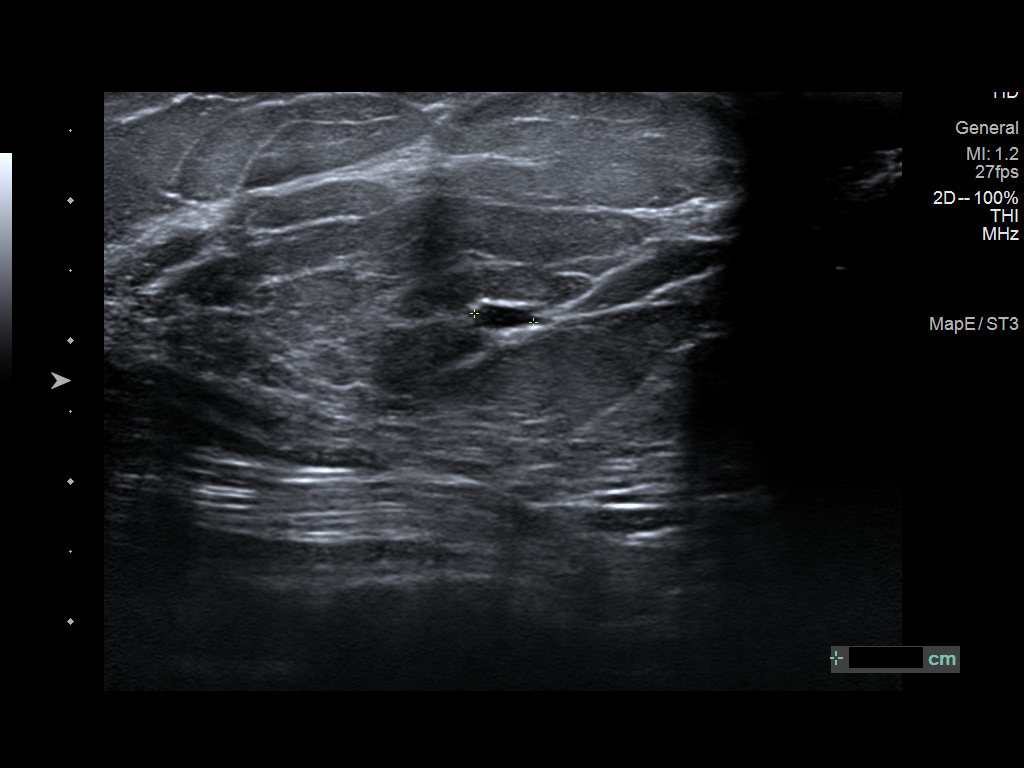
[im 5/5]
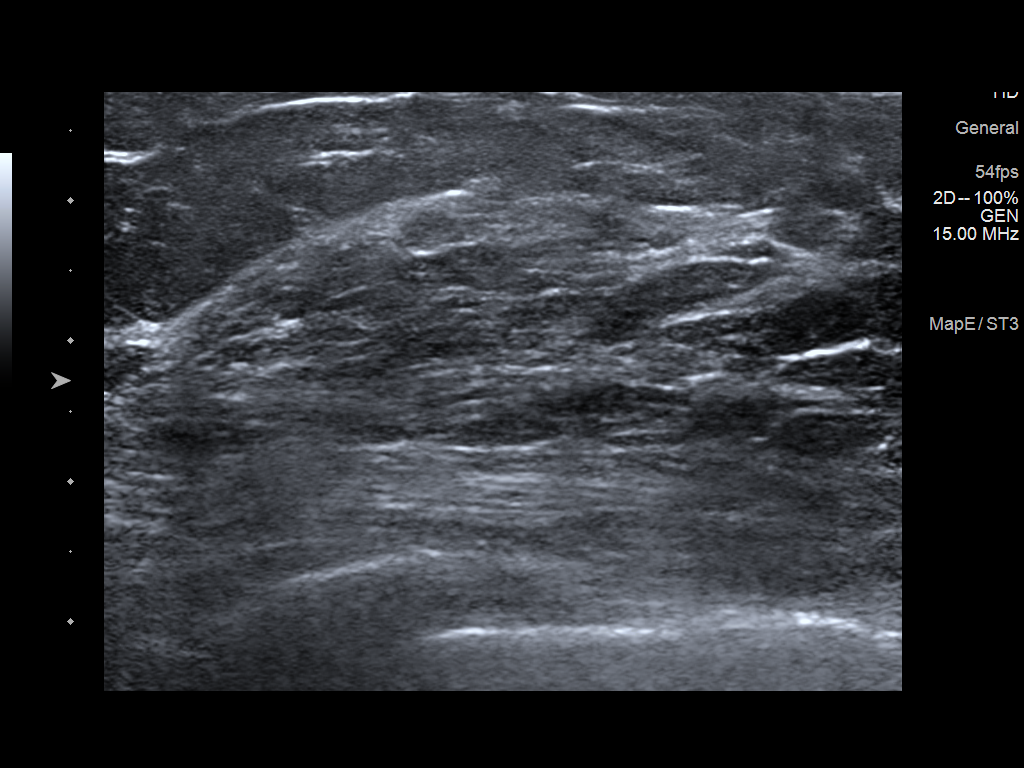

[5 of 5 positions shown; findings below may reference images not displayed]

ACR Breast Density Category b: There are scattered areas of
fibroglandular density.
FINDINGS: The mass in question appears to resolve on the spot compression
tomosynthesis images. However, it was prominent enough on the
screening MLO view the ultrasound be performed for further
evaluation.

Mammographic images were processed with CAD.

Ultrasound of the right breast at 12 o'clock, 1 cm from the nipple
demonstrates an anechoic circumscribed oval mass measuring 4 x 2 x 4
mm.
IMPRESSION: The mass in the right breast corresponds with a benign cyst.

RECOMMENDATION:
Screening mammogram in one year.(Code:IH-V-XGT)

I have discussed the findings and recommendations with the patient.
Results were also provided in writing at the conclusion of the
visit. If applicable, a reminder letter will be sent to the patient
regarding the next appointment.

BI-RADS CATEGORY  2: Benign.

## 2019-09-20 IMAGING — MG DIGITAL DIAGNOSTIC UNILATERAL RIGHT MAMMOGRAM WITH TOMO AND CAD
4 series · 4 of 12 positions shown · non-contrast
Comparison: Previous exam(s).

CLINICAL DATA: Screening recall for a possible right breast
mass.Patient is on an estrogen patch.

EXAM:
DIGITAL DIAGNOSTIC RIGHT MAMMOGRAM WITH CAD AND TOMO
ULTRASOUND RIGHT BREAST

[R MLO synth-2D]
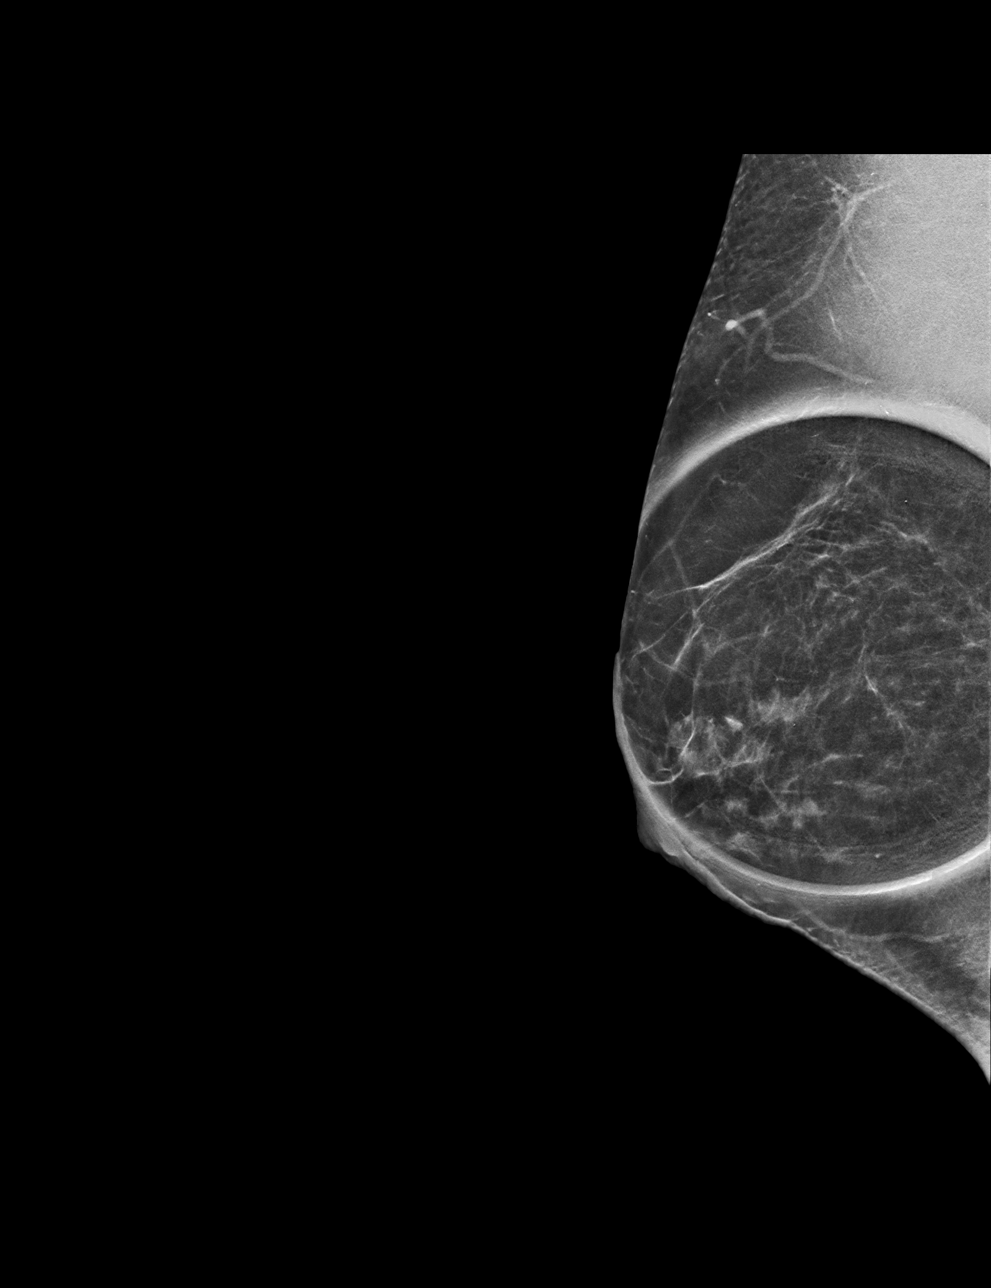

[R CC synth-2D]
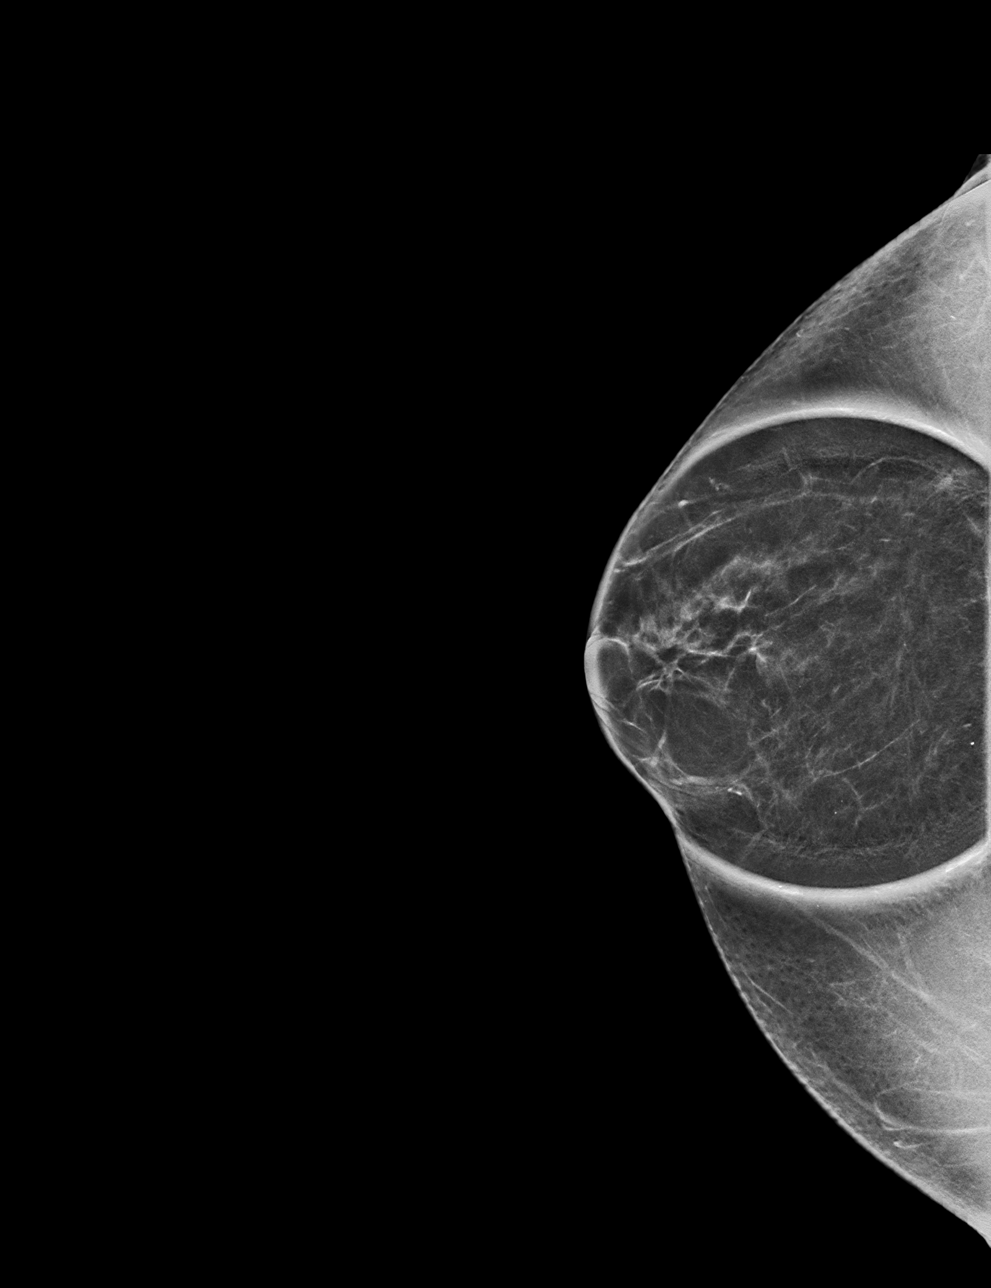

[R CC tomo · tomo slice 29/56.0]
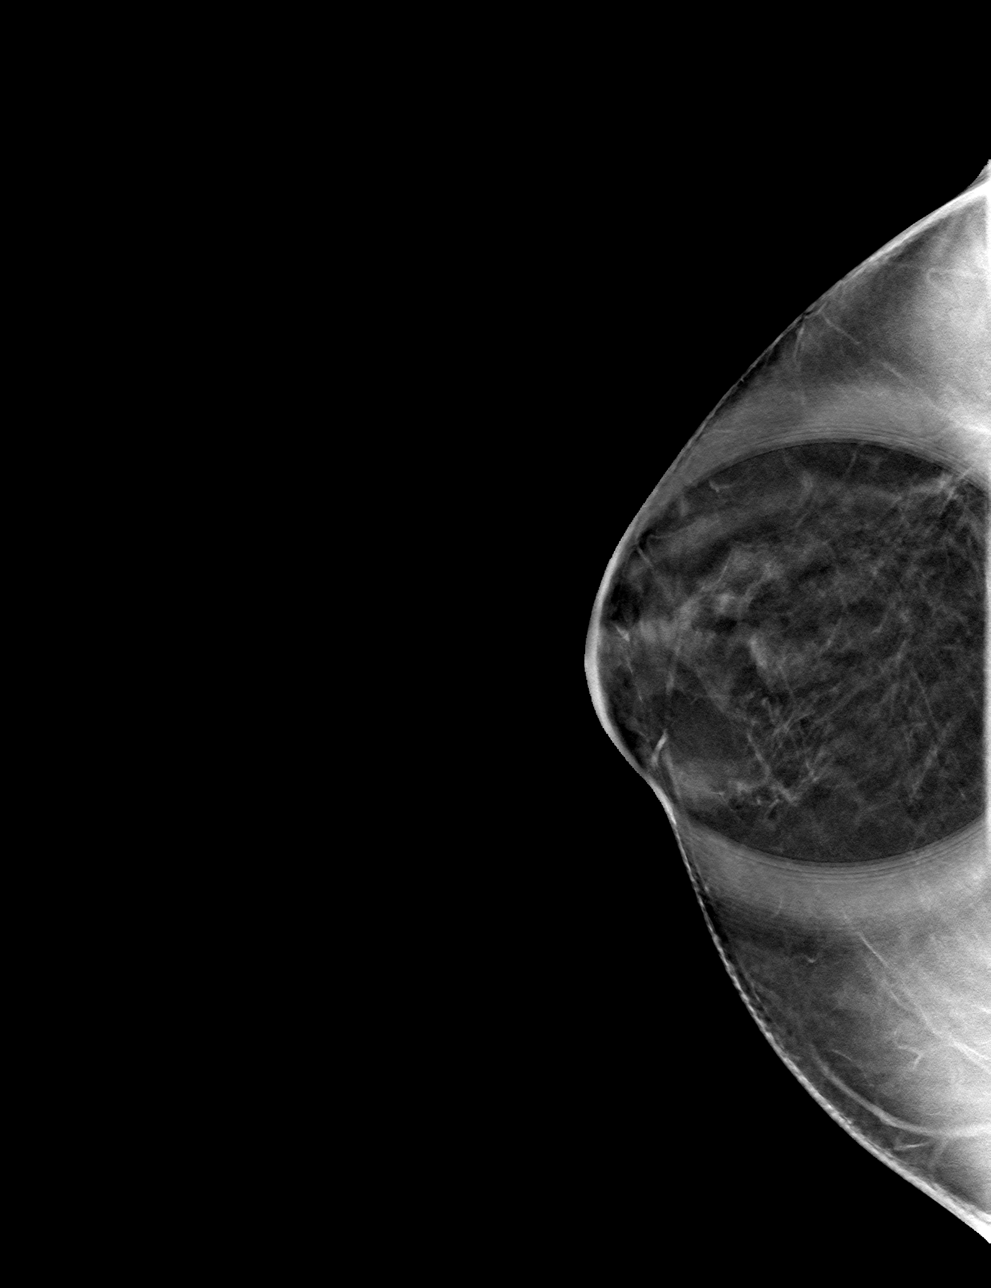

[R MLO tomo · tomo slice 27/54.0]
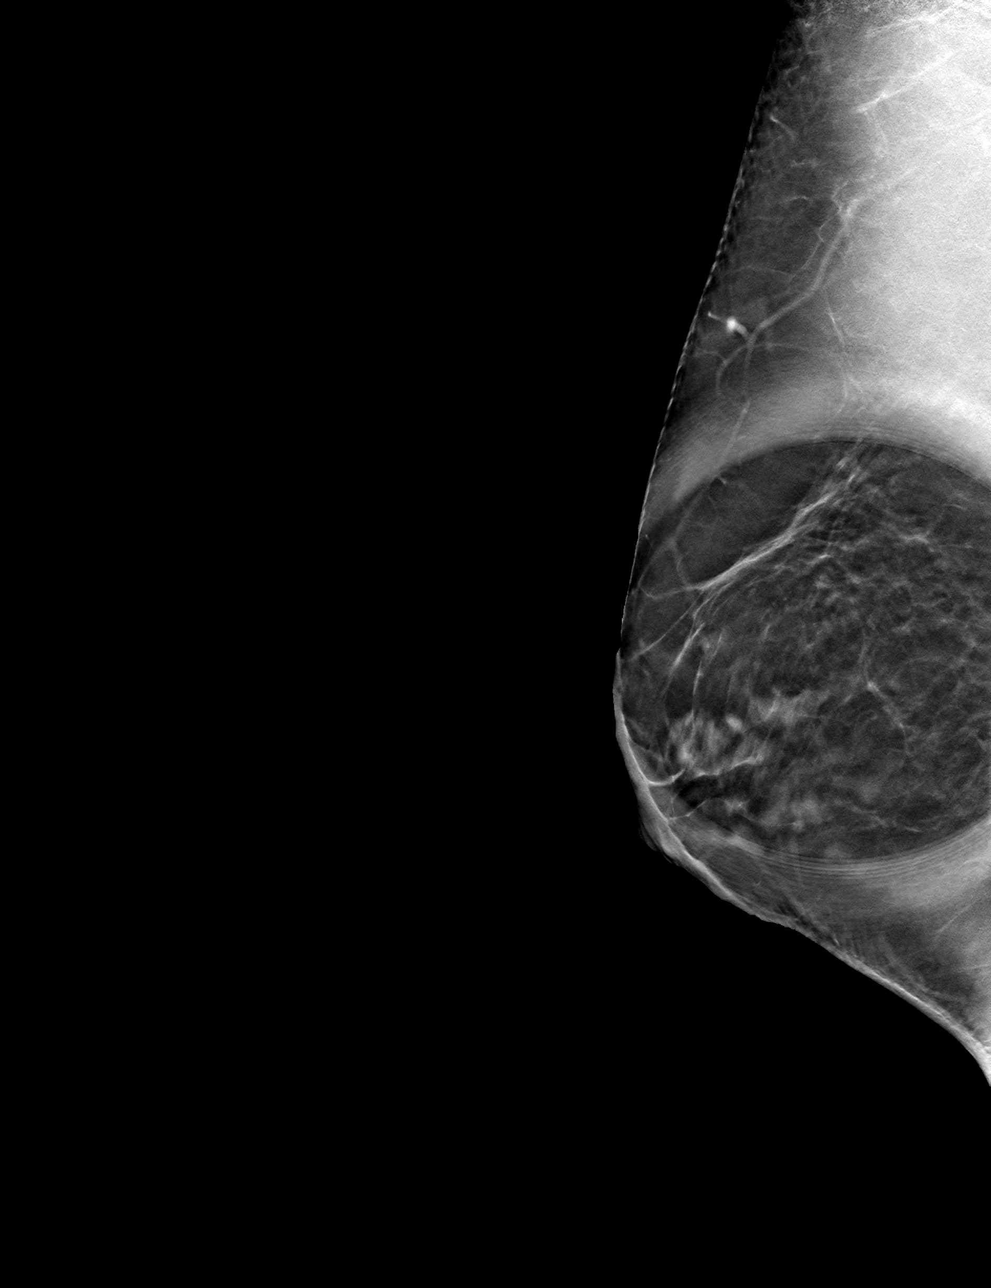

[4 of 12 positions shown; findings below may reference images not displayed]

ACR Breast Density Category b: There are scattered areas of
fibroglandular density.
FINDINGS: The mass in question appears to resolve on the spot compression
tomosynthesis images. However, it was prominent enough on the
screening MLO view the ultrasound be performed for further
evaluation.

Mammographic images were processed with CAD.

Ultrasound of the right breast at 12 o'clock, 1 cm from the nipple
demonstrates an anechoic circumscribed oval mass measuring 4 x 2 x 4
mm.
IMPRESSION: The mass in the right breast corresponds with a benign cyst.

RECOMMENDATION:
Screening mammogram in one year.(Code:IH-V-XGT)

I have discussed the findings and recommendations with the patient.
Results were also provided in writing at the conclusion of the
visit. If applicable, a reminder letter will be sent to the patient
regarding the next appointment.

BI-RADS CATEGORY  2: Benign.

## 2019-10-08 DIAGNOSIS — Z20828 Contact with and (suspected) exposure to other viral communicable diseases: Secondary | ICD-10-CM | POA: Diagnosis not present

## 2019-10-11 DIAGNOSIS — M47816 Spondylosis without myelopathy or radiculopathy, lumbar region: Secondary | ICD-10-CM | POA: Diagnosis not present

## 2019-11-14 DIAGNOSIS — Z124 Encounter for screening for malignant neoplasm of cervix: Secondary | ICD-10-CM | POA: Diagnosis not present

## 2019-11-14 DIAGNOSIS — Z1231 Encounter for screening mammogram for malignant neoplasm of breast: Secondary | ICD-10-CM | POA: Diagnosis not present

## 2019-11-14 DIAGNOSIS — Z01419 Encounter for gynecological examination (general) (routine) without abnormal findings: Secondary | ICD-10-CM | POA: Diagnosis not present

## 2019-11-14 DIAGNOSIS — Z6836 Body mass index (BMI) 36.0-36.9, adult: Secondary | ICD-10-CM | POA: Diagnosis not present

## 2019-12-17 DIAGNOSIS — Z20822 Contact with and (suspected) exposure to covid-19: Secondary | ICD-10-CM | POA: Diagnosis not present

## 2020-02-06 DIAGNOSIS — F5104 Psychophysiologic insomnia: Secondary | ICD-10-CM | POA: Diagnosis not present

## 2020-02-20 DIAGNOSIS — Z0000 Encounter for general adult medical examination without abnormal findings: Secondary | ICD-10-CM | POA: Diagnosis not present

## 2020-02-20 DIAGNOSIS — E669 Obesity, unspecified: Secondary | ICD-10-CM | POA: Diagnosis not present

## 2020-02-20 DIAGNOSIS — Z130 Encounter for screening for diseases of the blood and blood-forming organs and certain disorders involving the immune mechanism: Secondary | ICD-10-CM | POA: Diagnosis not present

## 2020-02-20 DIAGNOSIS — Z13220 Encounter for screening for lipoid disorders: Secondary | ICD-10-CM | POA: Diagnosis not present

## 2020-02-20 DIAGNOSIS — Z131 Encounter for screening for diabetes mellitus: Secondary | ICD-10-CM | POA: Diagnosis not present

## 2020-02-20 DIAGNOSIS — Z1329 Encounter for screening for other suspected endocrine disorder: Secondary | ICD-10-CM | POA: Diagnosis not present

## 2020-02-23 DIAGNOSIS — D582 Other hemoglobinopathies: Secondary | ICD-10-CM | POA: Diagnosis not present

## 2020-02-28 DIAGNOSIS — G4700 Insomnia, unspecified: Secondary | ICD-10-CM | POA: Diagnosis not present

## 2020-02-28 DIAGNOSIS — R3121 Asymptomatic microscopic hematuria: Secondary | ICD-10-CM | POA: Diagnosis not present

## 2020-02-28 DIAGNOSIS — Z6837 Body mass index (BMI) 37.0-37.9, adult: Secondary | ICD-10-CM | POA: Diagnosis not present

## 2020-02-28 DIAGNOSIS — R319 Hematuria, unspecified: Secondary | ICD-10-CM | POA: Diagnosis not present

## 2020-02-28 DIAGNOSIS — Z79899 Other long term (current) drug therapy: Secondary | ICD-10-CM | POA: Diagnosis not present

## 2020-02-28 DIAGNOSIS — D582 Other hemoglobinopathies: Secondary | ICD-10-CM | POA: Diagnosis not present

## 2020-02-28 DIAGNOSIS — E669 Obesity, unspecified: Secondary | ICD-10-CM | POA: Diagnosis not present

## 2020-03-05 DIAGNOSIS — D751 Secondary polycythemia: Secondary | ICD-10-CM | POA: Diagnosis not present

## 2020-03-05 DIAGNOSIS — K8020 Calculus of gallbladder without cholecystitis without obstruction: Secondary | ICD-10-CM | POA: Diagnosis not present

## 2020-03-06 DIAGNOSIS — Z6837 Body mass index (BMI) 37.0-37.9, adult: Secondary | ICD-10-CM | POA: Diagnosis not present

## 2020-03-06 DIAGNOSIS — R3121 Asymptomatic microscopic hematuria: Secondary | ICD-10-CM | POA: Insufficient documentation

## 2020-03-06 DIAGNOSIS — K7689 Other specified diseases of liver: Secondary | ICD-10-CM | POA: Diagnosis not present

## 2020-03-06 DIAGNOSIS — R911 Solitary pulmonary nodule: Secondary | ICD-10-CM | POA: Diagnosis not present

## 2020-03-06 DIAGNOSIS — D259 Leiomyoma of uterus, unspecified: Secondary | ICD-10-CM | POA: Diagnosis not present

## 2020-03-06 DIAGNOSIS — Z79899 Other long term (current) drug therapy: Secondary | ICD-10-CM | POA: Diagnosis not present

## 2020-03-06 DIAGNOSIS — D751 Secondary polycythemia: Secondary | ICD-10-CM | POA: Insufficient documentation

## 2020-03-06 DIAGNOSIS — G4700 Insomnia, unspecified: Secondary | ICD-10-CM | POA: Diagnosis not present

## 2020-03-06 DIAGNOSIS — D750 Familial erythrocytosis: Secondary | ICD-10-CM | POA: Diagnosis not present

## 2020-03-06 DIAGNOSIS — E669 Obesity, unspecified: Secondary | ICD-10-CM | POA: Diagnosis not present

## 2020-03-06 DIAGNOSIS — R319 Hematuria, unspecified: Secondary | ICD-10-CM | POA: Diagnosis not present

## 2020-03-06 DIAGNOSIS — D582 Other hemoglobinopathies: Secondary | ICD-10-CM | POA: Diagnosis not present

## 2020-03-14 DIAGNOSIS — D2272 Melanocytic nevi of left lower limb, including hip: Secondary | ICD-10-CM | POA: Diagnosis not present

## 2020-03-14 DIAGNOSIS — D2262 Melanocytic nevi of left upper limb, including shoulder: Secondary | ICD-10-CM | POA: Diagnosis not present

## 2020-03-14 DIAGNOSIS — D2261 Melanocytic nevi of right upper limb, including shoulder: Secondary | ICD-10-CM | POA: Diagnosis not present

## 2020-03-14 DIAGNOSIS — D225 Melanocytic nevi of trunk: Secondary | ICD-10-CM | POA: Diagnosis not present

## 2020-03-14 DIAGNOSIS — L918 Other hypertrophic disorders of the skin: Secondary | ICD-10-CM | POA: Diagnosis not present

## 2020-04-11 DIAGNOSIS — Z6836 Body mass index (BMI) 36.0-36.9, adult: Secondary | ICD-10-CM | POA: Diagnosis not present

## 2020-04-11 DIAGNOSIS — N950 Postmenopausal bleeding: Secondary | ICD-10-CM | POA: Diagnosis not present

## 2020-05-16 ENCOUNTER — Encounter (HOSPITAL_COMMUNITY): Payer: Self-pay | Admitting: *Deleted

## 2020-05-16 ENCOUNTER — Other Ambulatory Visit: Payer: Self-pay

## 2020-05-16 NOTE — Progress Notes (Signed)
Spoke w/ via phone for pre-op interview--- PT Lab needs dos---- CBC, T&S              Lab results------ no COVID test ------ 05-21-2020 @ 0900 Arrive at ------- 1030 NPO after ------ MN Medications to take morning of surgery ----- NOINE Diabetic medication ----- n/a Patient Special Instructions ----- n/a Pre-Op special Istructions ----- n/a Patient verbalized understanding of instructions that were given at this phone interview. Patient denies shortness of breath, chest pain, fever, cough a this phone interview.

## 2020-05-21 ENCOUNTER — Other Ambulatory Visit (HOSPITAL_COMMUNITY)
Admission: RE | Admit: 2020-05-21 | Discharge: 2020-05-21 | Disposition: A | Discharge status: Home / Self Care | Payer: Federal, State, Local not specified - PPO | Source: Ambulatory Visit | Attending: Obstetrics | Admitting: Obstetrics

## 2020-05-21 DIAGNOSIS — Z01812 Encounter for preprocedural laboratory examination: Secondary | ICD-10-CM | POA: Diagnosis not present

## 2020-05-21 DIAGNOSIS — Z20822 Contact with and (suspected) exposure to covid-19: Secondary | ICD-10-CM | POA: Diagnosis not present

## 2020-05-21 LAB — SARS CORONAVIRUS 2 (TAT 6-24 HRS): SARS Coronavirus 2: NEGATIVE

## 2020-05-23 ENCOUNTER — Ambulatory Visit (HOSPITAL_BASED_OUTPATIENT_CLINIC_OR_DEPARTMENT_OTHER): Payer: Federal, State, Local not specified - PPO | Admitting: Anesthesiology

## 2020-05-23 ENCOUNTER — Encounter (HOSPITAL_BASED_OUTPATIENT_CLINIC_OR_DEPARTMENT_OTHER): Payer: Self-pay | Admitting: Obstetrics

## 2020-05-23 ENCOUNTER — Encounter (HOSPITAL_BASED_OUTPATIENT_CLINIC_OR_DEPARTMENT_OTHER)
Admission: RE | Disposition: A | Discharge status: Home / Self Care | Payer: Self-pay | Source: Home / Self Care | Attending: Obstetrics

## 2020-05-23 ENCOUNTER — Ambulatory Visit (HOSPITAL_BASED_OUTPATIENT_CLINIC_OR_DEPARTMENT_OTHER)
Admission: RE | Admit: 2020-05-23 | Discharge: 2020-05-23 | Disposition: A | Discharge status: Home / Self Care | Payer: Federal, State, Local not specified - PPO | Attending: Obstetrics | Admitting: Obstetrics

## 2020-05-23 ENCOUNTER — Other Ambulatory Visit: Payer: Self-pay

## 2020-05-23 DIAGNOSIS — E669 Obesity, unspecified: Secondary | ICD-10-CM | POA: Insufficient documentation

## 2020-05-23 DIAGNOSIS — N950 Postmenopausal bleeding: Secondary | ICD-10-CM | POA: Insufficient documentation

## 2020-05-23 DIAGNOSIS — Z6836 Body mass index (BMI) 36.0-36.9, adult: Secondary | ICD-10-CM | POA: Diagnosis not present

## 2020-05-23 DIAGNOSIS — G4700 Insomnia, unspecified: Secondary | ICD-10-CM | POA: Insufficient documentation

## 2020-05-23 DIAGNOSIS — Z9889 Other specified postprocedural states: Secondary | ICD-10-CM

## 2020-05-23 DIAGNOSIS — M1990 Unspecified osteoarthritis, unspecified site: Secondary | ICD-10-CM | POA: Insufficient documentation

## 2020-05-23 DIAGNOSIS — N736 Female pelvic peritoneal adhesions (postinfective): Secondary | ICD-10-CM | POA: Diagnosis not present

## 2020-05-23 DIAGNOSIS — D751 Secondary polycythemia: Secondary | ICD-10-CM | POA: Insufficient documentation

## 2020-05-23 HISTORY — PX: HYSTEROSCOPY WITH D & C: SHX1775

## 2020-05-23 HISTORY — DX: Insomnia, unspecified: G47.00

## 2020-05-23 HISTORY — DX: Unspecified osteoarthritis, unspecified site: M19.90

## 2020-05-23 HISTORY — DX: Secondary polycythemia: D75.1

## 2020-05-23 HISTORY — DX: Presence of spectacles and contact lenses: Z97.3

## 2020-05-23 HISTORY — DX: Other seasonal allergic rhinitis: J30.2

## 2020-05-23 HISTORY — DX: Leiomyoma of uterus, unspecified: D25.9

## 2020-05-23 HISTORY — DX: Postmenopausal bleeding: N95.0

## 2020-05-23 LAB — CBC
HCT: 49.5 % — ABNORMAL HIGH (ref 36.0–46.0)
Hemoglobin: 17.2 g/dL — ABNORMAL HIGH (ref 12.0–15.0)
MCH: 30.9 pg (ref 26.0–34.0)
MCHC: 34.7 g/dL (ref 30.0–36.0)
MCV: 89 fL (ref 80.0–100.0)
Platelets: 267 10*3/uL (ref 150–400)
RBC: 5.56 MIL/uL — ABNORMAL HIGH (ref 3.87–5.11)
RDW: 12.9 % (ref 11.5–15.5)
WBC: 8.4 10*3/uL (ref 4.0–10.5)
nRBC: 0 % (ref 0.0–0.2)

## 2020-05-23 LAB — TYPE AND SCREEN
ABO/RH(D): A POS
Antibody Screen: NEGATIVE

## 2020-05-23 LAB — POCT PREGNANCY, URINE: Preg Test, Ur: NEGATIVE

## 2020-05-23 LAB — ABO/RH: ABO/RH(D): A POS

## 2020-05-23 SURGERY — DILATATION AND CURETTAGE /HYSTEROSCOPY
Anesthesia: General | Site: Vagina

## 2020-05-23 MED ORDER — PROPOFOL 10 MG/ML IV BOLUS
INTRAVENOUS | Status: AC
  Filled 2020-05-23: qty 20

## 2020-05-23 MED ORDER — FENTANYL CITRATE (PF) 100 MCG/2ML IJ SOLN: 25.0000 ug | INTRAMUSCULAR | Status: DC | PRN

## 2020-05-23 MED ORDER — ONDANSETRON HCL 4 MG/2ML IJ SOLN: 4.0000 mg | Freq: Once | INTRAMUSCULAR | Status: DC | PRN

## 2020-05-23 MED ORDER — PROPOFOL 10 MG/ML IV BOLUS
INTRAVENOUS | Status: DC | PRN
  Administered 2020-05-23: 200 mg via INTRAVENOUS

## 2020-05-23 MED ORDER — OXYCODONE HCL 5 MG PO TABS
ORAL_TABLET | ORAL | Status: AC
  Filled 2020-05-23: qty 1

## 2020-05-23 MED ORDER — ONDANSETRON HCL 4 MG/2ML IJ SOLN
INTRAMUSCULAR | Status: AC
  Filled 2020-05-23: qty 2

## 2020-05-23 MED ORDER — LIDOCAINE 2% (20 MG/ML) 5 ML SYRINGE
INTRAMUSCULAR | Status: AC
  Filled 2020-05-23: qty 5

## 2020-05-23 MED ORDER — SODIUM CHLORIDE 0.9 % IR SOLN
Status: DC | PRN
  Administered 2020-05-23: 3000 mL

## 2020-05-23 MED ORDER — FENTANYL CITRATE (PF) 100 MCG/2ML IJ SOLN
INTRAMUSCULAR | Status: DC | PRN
  Administered 2020-05-23 (×4): 25 ug via INTRAVENOUS

## 2020-05-23 MED ORDER — LACTATED RINGERS IV SOLN: INTRAVENOUS | Status: DC

## 2020-05-23 MED ORDER — ONDANSETRON HCL 4 MG/2ML IJ SOLN
INTRAMUSCULAR | Status: DC | PRN
  Administered 2020-05-23: 4 mg via INTRAVENOUS

## 2020-05-23 MED ORDER — FENTANYL CITRATE (PF) 100 MCG/2ML IJ SOLN
INTRAMUSCULAR | Status: AC
  Filled 2020-05-23: qty 2

## 2020-05-23 MED ORDER — DEXAMETHASONE SODIUM PHOSPHATE 10 MG/ML IJ SOLN
INTRAMUSCULAR | Status: DC | PRN
  Administered 2020-05-23: 10 mg via INTRAVENOUS

## 2020-05-23 MED ORDER — MIDAZOLAM HCL 5 MG/5ML IJ SOLN
INTRAMUSCULAR | Status: DC | PRN
  Administered 2020-05-23: 2 mg via INTRAVENOUS

## 2020-05-23 MED ORDER — MIDAZOLAM HCL 2 MG/2ML IJ SOLN
INTRAMUSCULAR | Status: AC
  Filled 2020-05-23: qty 2

## 2020-05-23 MED ORDER — MEPERIDINE HCL 25 MG/ML IJ SOLN: 6.2500 mg | INTRAMUSCULAR | Status: DC | PRN

## 2020-05-23 MED ORDER — DEXAMETHASONE SODIUM PHOSPHATE 10 MG/ML IJ SOLN
INTRAMUSCULAR | Status: AC
  Filled 2020-05-23: qty 1

## 2020-05-23 MED ORDER — LIDOCAINE 2% (20 MG/ML) 5 ML SYRINGE
INTRAMUSCULAR | Status: DC | PRN
  Administered 2020-05-23: 100 mg via INTRAVENOUS

## 2020-05-23 MED ORDER — OXYCODONE HCL 5 MG/5ML PO SOLN: 5.0000 mg | Freq: Once | ORAL | Status: AC | PRN

## 2020-05-23 MED ORDER — OXYCODONE HCL 5 MG PO TABS
5.0000 mg | ORAL_TABLET | Freq: Once | ORAL | Status: AC | PRN
  Administered 2020-05-23: 5 mg via ORAL

## 2020-05-23 MED ORDER — LIDOCAINE HCL 1 % IJ SOLN
INTRAMUSCULAR | Status: DC | PRN
  Administered 2020-05-23: 10 mL

## 2020-05-23 SURGICAL SUPPLY — 19 items
CATH ROBINSON RED A/P 16FR (CATHETERS) ×1 IMPLANT
CNTNR URN SCR LID CUP LEK RST (MISCELLANEOUS) IMPLANT
CONT SPEC 4OZ STRL OR WHT (MISCELLANEOUS)
COVER WAND RF STERILE (DRAPES) ×2 IMPLANT
DEVICE MYOSURE LITE (MISCELLANEOUS) ×2 IMPLANT
GAUZE 4X4 16PLY RFD (DISPOSABLE) ×2 IMPLANT
GLOVE BIOGEL PI IND STRL 6.5 (GLOVE) ×1 IMPLANT
GLOVE BIOGEL PI IND STRL 7.0 (GLOVE) ×1 IMPLANT
GLOVE BIOGEL PI INDICATOR 6.5 (GLOVE) ×1
GLOVE BIOGEL PI INDICATOR 7.0 (GLOVE) ×1
GLOVE ECLIPSE 6.0 STRL STRAW (GLOVE) ×2 IMPLANT
GOWN STRL REUS W/TWL LRG LVL3 (GOWN DISPOSABLE) ×4 IMPLANT
IV NS IRRIG 3000ML ARTHROMATIC (IV SOLUTION) ×2 IMPLANT
KIT PROCEDURE FLUENT (KITS) ×2 IMPLANT
PACK VAGINAL MINOR WOMEN LF (CUSTOM PROCEDURE TRAY) ×2 IMPLANT
PAD OB MATERNITY 4.3X12.25 (PERSONAL CARE ITEMS) ×2 IMPLANT
SEAL ROD LENS SCOPE MYOSURE (ABLATOR) ×1 IMPLANT
TOWEL OR 17X26 10 PK STRL BLUE (TOWEL DISPOSABLE) ×2 IMPLANT
WATER STERILE IRR 500ML POUR (IV SOLUTION) ×2 IMPLANT

## 2020-05-23 NOTE — H&P (Signed)
54 y.o.  G1P1 presents for hysteroscopy, D&C for recurrent postmenopausal bleeding.  She previously had spotting in 2018--had [place] that showed thickened strip at 5.8mm EMB was normal. At [facility], noted spotting x 1 episode when she was late changing her patch (about 3 mo prior). Had not recurred so elected expectant management. Had spotting in April--lasting for a few days.  Then spotting returned in early May so called for evaluation.  She underwent a pelvic ultrasound that showed thickened endometrium at 13 mm. She has known uterine fibroids (small, 1-4 cm), but they are subserosal and intramural and none appear to impact the uterine cavity.  There appears to be anechoic material within the endometrial cavity.  She has a history of endometrial ablation, but continued to have normal menses post-ablation prior to menopause.  Individually, the anterior / posterior aspects of the endometrium each measure 37mm     Past Medical History:  Diagnosis Date  . Arthritis   . Erythrocytosis    followed by Dr Tressie Stalker ([facility] in [place])  asymptomatic  . Insomnia   . PMB (postmenopausal bleeding)   . Seasonal allergic rhinitis   . Uterine fibroid   . Wears glasses     Past Surgical History:  Procedure Laterality Date  . COLONOSCOPY  2015 approx.  Marland Kitchen HYSTEROSCOPY W/ ENDOMETRIAL ABLATION  05-27-2007   @[facility]    w/ Novasure    OB History  No obstetric history on file.    Social History   Socioeconomic History  . Marital status: Married    Spouse name: Not on file  . Number of children: Not on file  . Years of education: Not on file  . Highest education level: Not on file  Occupational History  . Not on file  Tobacco Use  . Smoking status: Never Smoker  . Smokeless tobacco: Never Used  Substance and Sexual Activity  . Alcohol use: No  . Drug use: No  . Sexual activity: Not on file  Other Topics Concern  . Not on file  Social History Narrative  . Not on file       Patient has no  known allergies.    Vitals:   05/23/20 1057  BP: (!) 147/70  Pulse: 82  Resp: 16  Temp: 97.9 F (36.6 C)  SpO2: 99%     General:  NAD Abdomen:  soft     A/P   54 y.o.  G1P1 presents for hysteroscopy, dilation and curettage for recurrent postmenopausal bleeding.   Discussed risks to include infection, bleeding, damage to surrounding structures (including but not limited to vagina, cervix, bladder, uterus), uterine perforation, inability to dilate cervix or access cavity, need for additional procedures.  All questions answered and patient elects to proceed.   [place]

## 2020-05-23 NOTE — Anesthesia Preprocedure Evaluation (Addendum)
Anesthesia Evaluation  Patient identified by MRN, date of birth, ID band Patient awake    Reviewed: Allergy & Precautions, NPO status , Patient's Chart, lab work & pertinent test results  Airway Mallampati: II  TM Distance: >3 FB Neck ROM: Full    Dental no notable dental hx. (+) Teeth Intact, Caps   Pulmonary neg pulmonary ROS,    Pulmonary exam normal breath sounds clear to auscultation       Cardiovascular negative cardio ROS Normal cardiovascular exam Rhythm:Regular Rate:Normal     Neuro/Psych Insomnia negative psych ROS   GI/Hepatic negative GI ROS, Neg liver ROS,   Endo/Other  Obesity  Renal/GU negative Renal ROS  negative genitourinary   Musculoskeletal  (+) Arthritis , Osteoarthritis,    Abdominal (+) + obese,   Peds  Hematology Erythrocytosis   Anesthesia Other Findings   Reproductive/Obstetrics Post menopausal bleeding                             Anesthesia Physical Anesthesia Plan  ASA: II  Anesthesia Plan: General   Post-op Pain Management:    Induction: Intravenous  PONV Risk Score and Plan: 4 or greater and Midazolam, Ondansetron, Dexamethasone and Treatment may vary due to age or medical condition  Airway Management Planned: LMA  Additional Equipment:   Intra-op Plan:   Post-operative Plan: Extubation in OR  Informed Consent: I have reviewed the patients History and Physical, chart, labs and discussed the procedure including the risks, benefits and alternatives for the proposed anesthesia with the patient or authorized representative who has indicated his/her understanding and acceptance.     Dental advisory given  Plan Discussed with: Anesthesiologist, CRNA and Surgeon  Anesthesia Plan Comments:         Anesthesia Quick Evaluation

## 2020-05-23 NOTE — Anesthesia Procedure Notes (Signed)
Procedure Name: LMA Insertion Date/Time: 05/23/2020 12:30 PM Performed by: Genelle Bal, CRNA Pre-anesthesia Checklist: Patient identified, Emergency Drugs available, Suction available and Patient being monitored Patient Re-evaluated:Patient Re-evaluated prior to induction Oxygen Delivery Method: Circle system utilized Preoxygenation: Pre-oxygenation with 100% oxygen Induction Type: IV induction Ventilation: Mask ventilation without difficulty LMA: LMA inserted LMA Size: 4.0 Number of attempts: 1 Airway Equipment and Method: Bite block Placement Confirmation: positive ETCO2 Tube secured with: Tape Dental Injury: Teeth and Oropharynx as per pre-operative assessment

## 2020-05-23 NOTE — Anesthesia Postprocedure Evaluation (Signed)
Anesthesia Post Note  Patient: Robin Bowman  Procedure(s) Performed: DILATATION AND CURETTAGE /HYSTEROSCOPY (N/A Vagina )     Patient location during evaluation: PACU Anesthesia Type: General Level of consciousness: awake and alert and oriented Pain management: pain level controlled Vital Signs Assessment: post-procedure vital signs reviewed and stable Respiratory status: spontaneous breathing, nonlabored ventilation and respiratory function stable Cardiovascular status: blood pressure returned to baseline and stable Postop Assessment: no apparent nausea or vomiting Anesthetic complications: no   No complications documented.  Last Vitals:  Vitals:   05/23/20 1315 05/23/20 1330  BP: 135/77 132/75  Pulse: 88 80  Resp: 11 17  Temp: 36.4 C   SpO2: 100% 98%    Last Pain:  Vitals:   05/23/20 1315  TempSrc:   PainSc: 0-No pain                 Thorn Demas A.

## 2020-05-23 NOTE — Transfer of Care (Signed)
Immediate Anesthesia Transfer of Care Note  Patient: Robin Bowman  Procedure(s) Performed: Procedure(s) (LRB): DILATATION AND CURETTAGE /HYSTEROSCOPY (N/A)  Patient Location: PACU  Anesthesia Type: General  Level of Consciousness: awake, sedated, patient cooperative and responds to stimulation  Airway & Oxygen Therapy: Patient Spontanous Breathing and Patient connected to [place] 02 and soft FM   Post-op Assessment: Report given to PACU RN, Post -op Vital signs reviewed and stable and Patient moving all extremities  Post vital signs: Reviewed and stable  Complications: No apparent anesthesia complications

## 2020-05-23 NOTE — Discharge Instructions (Signed)
DISCHARGE INSTRUCTIONS: D&C / D&E The following instructions have been prepared to help you care for yourself upon your return home.   Personal hygiene:  Use sanitary pads for vaginal drainage, not tampons.  Shower the day after your procedure.  NO tub baths, pools or Jacuzzis for 2-3 weeks.  Wipe front to back after using the bathroom.  Activity and limitations:  Do NOT drive or operate any equipment for 24 hours. The effects of anesthesia are still present and drowsiness may result.  Do NOT rest in bed all day.  Walking is encouraged.  Walk up and down stairs slowly.  You may resume your normal activity in one to two days or as indicated by your physician.  Sexual activity: NO intercourse for at least 2 weeks after the procedure, or as indicated by your physician.  Diet: Eat a light meal as desired this evening. You may resume your usual diet tomorrow.  Return to work: You may resume your work activities in one to two days or as indicated by your doctor.  What to expect after your surgery: Expect to have vaginal bleeding/discharge for 2-3 days and spotting for up to 10 days. It is not unusual to have soreness for up to 1-2 weeks. You may have a slight burning sensation when you urinate for the first day. Mild cramps may continue for a couple of days. You may have a regular period in 2-6 weeks.  Call your doctor for any of the following:  Excessive vaginal bleeding, saturating and changing one pad every hour.  Inability to urinate 6 hours after discharge from hospital.  Pain not relieved by pain medication.  Fever of 100.4 F or greater.  Unusual vaginal discharge or odor.    Post Anesthesia Home Care Instructions  Activity: Get plenty of rest for the remainder of the day. A responsible individual must stay with you for 24 hours following the procedure.  For the next 24 hours, DO NOT: -Drive a car -Operate machinery -Drink alcoholic beverages -Take any medication unless  instructed by your physician -Make any legal decisions or sign important papers.  Meals: Start with liquid foods such as gelatin or soup. Progress to regular foods as tolerated. Avoid greasy, spicy, heavy foods. If nausea and/or vomiting occur, drink only clear liquids until the nausea and/or vomiting subsides. Call your physician if vomiting continues.  Special Instructions/Symptoms: Your throat may feel dry or sore from the anesthesia or the breathing tube placed in your throat during surgery. If this causes discomfort, gargle with warm salt water. The discomfort should disappear within 24 hours.  

## 2020-05-23 NOTE — Op Note (Signed)
Operative Note  Pre-operative Diagnosis: recurrent post menopausal bleeding  Post-operative Diagnosis: recurrent post menopausal bleeding  Surgeon: Jerelyn Charles, MD  Procedure: hysteroscopy, dilation and curettage  Anesthesia: general  Estimated Blood Loss: 10 mL         Specimens: endometrial curettings          Findings: Anteverted uterus.  Right tubal ostia visualized.  Adhesions in upper left cavity obscuring visualization of the left tubal ostia.  Endometrium was otherwise is thin and atrophic.  Description of Procedure:         After adequate anesthesia was achieved, the patient placed in the dorsal lithotomy position in [place].  She was prepped and draped in the usual sterile fashion.  A bimanual exam revealed an anteverted uterus.  The bivalve speculum was placed in the vagina and the anterior lip of the cervix grasped with a single-tooth tenaculum.  10 mL of 1% lidocaine was placed for a paracervical block. The cervix was serially dilated with Hank dilators to 92mm.  Under direct visualization, the hysteroscope was advanced.  The uterine cavity was surveyed. The right tubal ostia was identified as above.  The left tubal ostia was not visualized as there appeared to be intrauterine adhesions in the upper left part of the cavity obscuring the left tubal ostia.  The endometrium was noted to be thin and atrophic.  The hysteroscope was removed and sharp curettage was performed.  The hysteroscope was advanced back into the cavity and the decision was then made to directly sample the area of adhesions. A myosure lite device was advanced under direct visualization and a target biopsy of the upper left uterus was performed.  As the tissue was sampled and resected, the left tubal ostia was still unable to be identified.  As the goal of the procedure was to rule out malignancy, it was not felt to be prudent to attempt to restore normal intrauterine anatomy in a postmenopausal uterus.  The  hysteroscope was removed.  The endometrial curettings were sent to pathology.  All the vaginal instruments were removed.  Counts were correct.  The patient was awakened from anesthesia and transferred to PACU in stable condition.    [place], [facility]

## 2020-05-24 ENCOUNTER — Encounter (HOSPITAL_BASED_OUTPATIENT_CLINIC_OR_DEPARTMENT_OTHER): Payer: Self-pay | Admitting: Obstetrics

## 2020-05-24 LAB — SURGICAL PATHOLOGY

## 2020-05-24 NOTE — Progress Notes (Signed)
Pt wants to make sure her Covid vaccination was documented in her medical record. She received 2 doses of Phizer vaccine first dose was February 07, 2020 Lot #[id number] given at the vaccine clinic from [facility]  Her dose was March 01, 2020 Lot #[id number] also at the [facility] clinic.

## 2020-06-13 ENCOUNTER — Other Ambulatory Visit: Payer: Self-pay | Admitting: Sports Medicine

## 2020-06-13 DIAGNOSIS — M545 Low back pain, unspecified: Secondary | ICD-10-CM

## 2020-06-13 DIAGNOSIS — M25551 Pain in right hip: Secondary | ICD-10-CM | POA: Diagnosis not present

## 2020-06-13 DIAGNOSIS — M47816 Spondylosis without myelopathy or radiculopathy, lumbar region: Secondary | ICD-10-CM | POA: Diagnosis not present

## 2020-06-20 DIAGNOSIS — F5101 Primary insomnia: Secondary | ICD-10-CM | POA: Diagnosis not present

## 2020-06-20 DIAGNOSIS — G4710 Hypersomnia, unspecified: Secondary | ICD-10-CM | POA: Diagnosis not present

## 2020-06-20 DIAGNOSIS — R0683 Snoring: Secondary | ICD-10-CM | POA: Diagnosis not present

## 2020-06-20 DIAGNOSIS — D751 Secondary polycythemia: Secondary | ICD-10-CM | POA: Diagnosis not present

## 2020-07-08 ENCOUNTER — Ambulatory Visit
Admission: RE | Admit: 2020-07-08 | Discharge: 2020-07-08 | Disposition: A | Discharge status: Home / Self Care | Payer: Federal, State, Local not specified - PPO | Source: Ambulatory Visit | Attending: Sports Medicine | Admitting: Sports Medicine

## 2020-07-08 ENCOUNTER — Other Ambulatory Visit: Payer: Self-pay

## 2020-07-08 DIAGNOSIS — M47817 Spondylosis without myelopathy or radiculopathy, lumbosacral region: Secondary | ICD-10-CM | POA: Diagnosis not present

## 2020-07-08 DIAGNOSIS — M545 Low back pain, unspecified: Secondary | ICD-10-CM

## 2020-07-08 DIAGNOSIS — M47816 Spondylosis without myelopathy or radiculopathy, lumbar region: Secondary | ICD-10-CM | POA: Diagnosis not present

## 2020-07-08 DIAGNOSIS — M5127 Other intervertebral disc displacement, lumbosacral region: Secondary | ICD-10-CM | POA: Diagnosis not present

## 2020-07-08 DIAGNOSIS — M5126 Other intervertebral disc displacement, lumbar region: Secondary | ICD-10-CM | POA: Diagnosis not present

## 2020-07-19 ENCOUNTER — Other Ambulatory Visit: Payer: Self-pay

## 2020-07-19 ENCOUNTER — Ambulatory Visit (INDEPENDENT_AMBULATORY_CARE_PROVIDER_SITE_OTHER): Payer: Federal, State, Local not specified - PPO | Admitting: Pulmonary Disease

## 2020-07-19 ENCOUNTER — Encounter: Payer: Self-pay | Admitting: Pulmonary Disease

## 2020-07-19 VITALS — BP 134/70 | HR 88 | Temp 97.3°F | Ht 63.5 in | Wt 214.8 lb

## 2020-07-19 DIAGNOSIS — G4733 Obstructive sleep apnea (adult) (pediatric): Secondary | ICD-10-CM | POA: Diagnosis not present

## 2020-07-19 NOTE — Patient Instructions (Signed)
Erythrocytosis Concern for obstructive sleep apnea  We will schedule you for home sleep study Update you with results as soon as reviewed  Treatment options as we discussed  Follow-up in 3 months  Whenever you get your CT scan performed, request for a CD of the films-both current and previous  Call with significant concerns  Graded exercises, weight loss efforts

## 2020-07-19 NOTE — Progress Notes (Signed)
Robin Bowman    [id number]    1966-02-07  Primary Care Physician:Howell, Bryn Gulling, MD  Referring Physician: Helane Rima, MD [address] [place],  [place] [address]  Chief complaint:   Being seen for concern for sleep disordered breathing  HPI:  She was recently found to have erythrocytosis-hematocrit 49.5 She did see a physician up at [place] an in lab study  Patient has significant insomnia and has been on medications for insomnia for she stated almost 20 years -Currently uses [name] which seems to get her about 3 to 4 hours of sleep -Feels she will not be able to sleep in the sleep lab and will not be able to get an in lab study performed  She admits to snoring, no apnea Denies any choking or gasping episodes No dryness of the mouth in the mornings No morning headaches  Recently had a CT scan which showed a 5 mm lung nodule -Has a repeat CT scan ordered for October  Usually tries to get in bed by 10-11, takes about 30 minutes to an hour to fall asleep About 3-4 awakenings Final up time is about 6:30 in the morning  Without a sleep aid, does not get any sleep at all  She has chronic back pain which is worse during the nighttime  She is concerned that she does have some component of claustrophobia  Outpatient Encounter Medications as of 07/19/2020  Medication Sig   b complex vitamins capsule Take 1 capsule by mouth daily.   diclofenac (VOLTAREN) 75 MG EC tablet Take 75 mg by mouth at bedtime.   estradiol-norethindrone (COMBIPATCH) 0.05-0.14 MG/DAY Place 1 patch onto the skin 2 (two) times a week.   Eszopiclone 3 MG TABS Take 3 mg by mouth at bedtime. [name] --- Take immediately before bedtime   fluticasone (FLONASE) 50 MCG/ACT nasal spray Place into both nostrils at bedtime.   Multiple Vitamin (MULTIVITAMIN) tablet Take 1 tablet by mouth daily.   pregabalin (LYRICA) 150 MG capsule Take 150 mg by mouth at bedtime.   No  facility-administered encounter medications on file as of 07/19/2020.    Allergies as of 07/19/2020   (No Known Allergies)    Past Medical History:  Diagnosis Date   Arthritis    Erythrocytosis    followed by Dr Tressie Stalker ([place] in [place])  asymptomatic   Insomnia    PMB (postmenopausal bleeding)    Seasonal allergic rhinitis    Uterine fibroid    Wears glasses     Past Surgical History:  Procedure Laterality Date   COLONOSCOPY  2015 approx.   HYSTEROSCOPY W/ ENDOMETRIAL ABLATION  05-27-2007   @[facility]    w/ Novasure   HYSTEROSCOPY WITH D & C N/A 05/23/2020   Procedure: DILATATION AND CURETTAGE /HYSTEROSCOPY;  Surgeon: Jerelyn Charles, MD;  Location: [facility];  Service: Gynecology;  Laterality: N/A;    History reviewed. No pertinent family history.  Social History   Socioeconomic History   Marital status: Married    Spouse name: Not on file   Number of children: Not on file   Years of education: Not on file   Highest education level: Not on file  Occupational History   Not on file  Tobacco Use   Smoking status: Never Smoker   Smokeless tobacco: Never Used  Substance and Sexual Activity   Alcohol use: No   Drug use: No   Sexual activity: Not on file  Other Topics  Concern   Not on file  Social History Narrative   Not on file   Social Determinants of Health   Financial Resource Strain:    Difficulty of Paying Living Expenses: Not on file  Food Insecurity:    Worried About [place] in the Last Year: Not on file   Ran Out of Food in the Last Year: Not on file  Transportation Needs:    Lack of Transportation (Medical): Not on file   Lack of Transportation (Non-Medical): Not on file  Physical Activity:    Days of Exercise per Week: Not on file   Minutes of Exercise per Session: Not on file  Stress:    Feeling of Stress : Not on file  Social Connections:    Frequency of Communication with  Friends and Family: Not on file   Frequency of Social Gatherings with Friends and Family: Not on file   Attends Religious Services: Not on file   Active Member of Clubs or Organizations: Not on file   Attends Archivist Meetings: Not on file   Marital Status: Not on file  Intimate Partner Violence:    Fear of Current or Ex-Partner: Not on file   Emotionally Abused: Not on file   Physically Abused: Not on file   Sexually Abused: Not on file    Review of Systems  Respiratory: Negative for shortness of breath.   Musculoskeletal: Positive for arthralgias and back pain.  Psychiatric/Behavioral: Positive for sleep disturbance.    Vitals:   07/19/20 1506  BP: 134/70  Pulse: 88  Temp: (!) 97.3 F (36.3 C)  SpO2: 100%     Physical Exam Constitutional:      Appearance: She is obese.  HENT:     Nose: No congestion.     Mouth/Throat:     Mouth: Mucous membranes are moist.     Pharynx: No oropharyngeal exudate.     Comments: Crowded oropharynx Eyes:     General:        Right eye: No discharge.        Left eye: No discharge.  Cardiovascular:     Rate and Rhythm: Regular rhythm.     Pulses: Normal pulses.     Heart sounds: Normal heart sounds. No murmur heard.  No friction rub.  Pulmonary:     Effort: Pulmonary effort is normal. No respiratory distress.     Breath sounds: Normal breath sounds. No stridor. No wheezing or rhonchi.  Musculoskeletal:     Cervical back: No rigidity or tenderness.  Skin:    General: Skin is warm.  Neurological:     Mental Status: She is alert.  Psychiatric:        Mood and Affect: Mood normal.    Data Reviewed: Records from [place] reviewed on care everywhere CT report noted for 5 mm lung nodule at left base  Assessment:  Moderate probability of significant obstructive sleep apnea -Snoring -No witnessed apneas -Nonrestorative sleep -Multiple awakenings  Lung nodule -Follow-up already scheduled  Severe chronic  insomnia -Unable to sleep without a sleep aid  Pathophysiology of sleep disordered breathing discussed with the patient Treatment options for sleep disordered breathing discussed with the patient  Plan/Recommendations:  Schedule patient for a home sleep study  Risks with not treating sleep disordered breathing discussed  Follow-up in place in about 3 months Encouraged to call with any significant concerns  Sherrilyn Rist MD [facility] 07/19/2020, 3:49 PM  CC: Lavone Neri,  Bryn Gulling, MD

## 2020-07-20 ENCOUNTER — Telehealth: Payer: Self-pay | Admitting: Pulmonary Disease

## 2020-07-20 NOTE — Telephone Encounter (Signed)
Note from 07/19/20 was faxed to [facility] at the number provided

## 2020-07-26 DIAGNOSIS — M545 Low back pain: Secondary | ICD-10-CM | POA: Diagnosis not present

## 2020-07-26 DIAGNOSIS — M47816 Spondylosis without myelopathy or radiculopathy, lumbar region: Secondary | ICD-10-CM | POA: Diagnosis not present

## 2020-08-23 ENCOUNTER — Other Ambulatory Visit: Payer: Self-pay

## 2020-08-23 ENCOUNTER — Ambulatory Visit: Payer: Federal, State, Local not specified - PPO

## 2020-08-23 DIAGNOSIS — G4733 Obstructive sleep apnea (adult) (pediatric): Secondary | ICD-10-CM

## 2020-08-23 DIAGNOSIS — M47816 Spondylosis without myelopathy or radiculopathy, lumbar region: Secondary | ICD-10-CM | POA: Diagnosis not present

## 2020-08-28 DIAGNOSIS — Z86010 Personal history of colonic polyps: Secondary | ICD-10-CM | POA: Diagnosis not present

## 2020-08-28 DIAGNOSIS — D582 Other hemoglobinopathies: Secondary | ICD-10-CM | POA: Diagnosis not present

## 2020-08-28 DIAGNOSIS — D751 Secondary polycythemia: Secondary | ICD-10-CM | POA: Diagnosis not present

## 2020-08-28 DIAGNOSIS — Z79899 Other long term (current) drug therapy: Secondary | ICD-10-CM | POA: Diagnosis not present

## 2020-08-28 DIAGNOSIS — R911 Solitary pulmonary nodule: Secondary | ICD-10-CM | POA: Diagnosis not present

## 2020-08-30 DIAGNOSIS — F5101 Primary insomnia: Secondary | ICD-10-CM | POA: Diagnosis not present

## 2020-08-31 DIAGNOSIS — Z86010 Personal history of colonic polyps: Secondary | ICD-10-CM | POA: Diagnosis not present

## 2020-08-31 DIAGNOSIS — D582 Other hemoglobinopathies: Secondary | ICD-10-CM | POA: Diagnosis not present

## 2020-08-31 DIAGNOSIS — D751 Secondary polycythemia: Secondary | ICD-10-CM | POA: Diagnosis not present

## 2020-08-31 DIAGNOSIS — R911 Solitary pulmonary nodule: Secondary | ICD-10-CM | POA: Diagnosis not present

## 2020-08-31 DIAGNOSIS — Z79899 Other long term (current) drug therapy: Secondary | ICD-10-CM | POA: Diagnosis not present

## 2020-09-03 ENCOUNTER — Telehealth: Payer: Self-pay | Admitting: Pulmonary Disease

## 2020-09-03 DIAGNOSIS — G4733 Obstructive sleep apnea (adult) (pediatric): Secondary | ICD-10-CM

## 2020-09-03 NOTE — Telephone Encounter (Deleted)
Order has been placed in chart for cpap usage sent to [facility]

## 2020-09-03 NOTE — Telephone Encounter (Signed)
Call patient  Sleep study result  Date of study: 08/23/2020  Impression: Mild obstructive sleep apnea No significant oxygen desaturations  Recommendation: Options of treatment for mild sleep disordered breathing  CPAP therapy should be considered current settings of 5-15 will be appropriate  An oral device may be considered as an option of treatment-this will involve a dentist fashioning an oral device  Aggressive weight loss measures will help  Follow-up 4 to 6 weeks following initiation of treatment

## 2020-09-03 NOTE — Telephone Encounter (Addendum)
Dr. Ander Slade has reviewed the home sleep test this showed mild sleep apnea.   Recommendations   Treatment options are CPAP with the settings auto 5 to15.    Weight loss measures .   Advise against driving while sleepy & against medication with sedative side effects.    Make appointment for 3 months for compliance with download with Dr. Ander Slade.     Pt was called given results of sleep study has decided to go with cpap and has agreed with recommendations.pt has f/u scheduled 11/22/ 2021.pt has order placed in chart for cpap compliance sent to [facility] to set up start up for cpap usage

## 2020-09-04 ENCOUNTER — Telehealth: Payer: Self-pay | Admitting: Pulmonary Disease

## 2020-09-04 NOTE — Telephone Encounter (Signed)
Spoke with the pt  She has multiple questions about her sleep study  Decided to go ahead and move up her Nov 2021 appt to next wk  Nothing further needed

## 2020-09-10 ENCOUNTER — Other Ambulatory Visit: Payer: Self-pay

## 2020-09-10 ENCOUNTER — Ambulatory Visit: Payer: Federal, State, Local not specified - PPO | Admitting: Pulmonary Disease

## 2020-09-10 ENCOUNTER — Encounter: Payer: Self-pay | Admitting: Pulmonary Disease

## 2020-09-10 VITALS — BP 126/72 | HR 83 | Temp 97.8°F | Ht 63.5 in | Wt 216.4 lb

## 2020-09-10 DIAGNOSIS — G4733 Obstructive sleep apnea (adult) (pediatric): Secondary | ICD-10-CM | POA: Diagnosis not present

## 2020-09-10 NOTE — Progress Notes (Signed)
Robin Bowman    [id number]    October 27, 1966  Primary Care Physician:Howell, Bryn Gulling, MD  Referring Physician: Helane Rima, MD [address] [place],  [place] [address]  Chief complaint:   Follow-up for mild obstructive sleep apnea  HPI:  Sleep study showed mild obstructive sleep apnea Patient came in for follow-up Discussed treatment options including CPAP therapy, oral device, are ongoing insomnia  Recently had a CT scan of the chest showing multiple lung nodules that are calcified over  She has no personal history of cancer, does not smoke, no family history of cancer lung cancer  She was recently found to have erythrocytosis-hematocrit 49.5 Continues to follow-up with oncology/hematology  She did see a physician up at [facility] an in lab study  Patient has significant insomnia and has been on medications for insomnia for she stated almost 20 years -Currently uses [name] which seems to get her about 3 to 4 hours of sleep -Feels she will not be able to sleep in the sleep lab and will not be able to get an in lab study performed  No new sleep symptoms  CT with multiple lung nodules-reviewed by myself  Usually tries to get in bed by 10-11, takes about 30 minutes to an hour to fall asleep About 3-4 awakenings Final up time is about 6:30 in the morning  Without a sleep aid, does not get any sleep at all  She has chronic back pain which is worse during the nighttime  She is concerned that she does have some component of claustrophobia  Outpatient Encounter Medications as of 09/10/2020  Medication Sig  . b complex vitamins capsule Take 1 capsule by mouth daily.  . diclofenac (VOLTAREN) 75 MG EC tablet Take 75 mg by mouth at bedtime.  Marland Kitchen estradiol-norethindrone (COMBIPATCH) 0.05-0.14 MG/DAY Place 1 patch onto the skin 2 (two) times a week.  . Eszopiclone 3 MG TABS Take 3 mg by mouth at bedtime. [name] --- Take immediately before bedtime  . fluticasone  (FLONASE) 50 MCG/ACT nasal spray Place into both nostrils at bedtime.  . Multiple Vitamin (MULTIVITAMIN) tablet Take 1 tablet by mouth daily.  . pregabalin (LYRICA) 150 MG capsule Take 150 mg by mouth at bedtime.   No facility-administered encounter medications on file as of 09/10/2020.    Allergies as of 09/10/2020  . (No Known Allergies)    Past Medical History:  Diagnosis Date  . Arthritis   . Erythrocytosis    followed by Dr Tressie Stalker ([place] in [place])  asymptomatic  . Insomnia   . PMB (postmenopausal bleeding)   . Seasonal allergic rhinitis   . Uterine fibroid   . Wears glasses     Past Surgical History:  Procedure Laterality Date  . COLONOSCOPY  2015 approx.  Marland Kitchen HYSTEROSCOPY W/ ENDOMETRIAL ABLATION  05-27-2007   @[facility]    w/ Novasure  . HYSTEROSCOPY WITH D & C N/A 05/23/2020   Procedure: DILATATION AND CURETTAGE /HYSTEROSCOPY;  Surgeon: Jerelyn Charles, MD;  Location: [facility];  Service: Gynecology;  Laterality: N/A;    No family history on file.  Social History   Socioeconomic History  . Marital status: Married    Spouse name: Not on file  . Number of children: Not on file  . Years of education: Not on file  . Highest education level: Not on file  Occupational History  . Not on file  Tobacco Use  . Smoking status: Never Smoker  .  Smokeless tobacco: Never Used  Substance and Sexual Activity  . Alcohol use: No  . Drug use: No  . Sexual activity: Not on file  Other Topics Concern  . Not on file  Social History Narrative  . Not on file   Social Determinants of Health   Financial Resource Strain:   . Difficulty of Paying Living Expenses: Not on file  Food Insecurity:   . Worried About Charity fundraiser in the Last Year: Not on file  . Ran Out of Food in the Last Year: Not on file  Transportation Needs:   . Lack of Transportation (Medical): Not on file  . Lack of Transportation (Non-Medical): Not on file  Physical Activity:    . Days of Exercise per Week: Not on file  . Minutes of Exercise per Session: Not on file  Stress:   . Feeling of Stress : Not on file  Social Connections:   . Frequency of Communication with Friends and Family: Not on file  . Frequency of Social Gatherings with Friends and Family: Not on file  . Attends Religious Services: Not on file  . Active Member of Clubs or Organizations: Not on file  . Attends Archivist Meetings: Not on file  . Marital Status: Not on file  Intimate Partner Violence:   . Fear of Current or Ex-Partner: Not on file  . Emotionally Abused: Not on file  . Physically Abused: Not on file  . Sexually Abused: Not on file    Review of Systems  Respiratory: Positive for apnea. Negative for shortness of breath.   Musculoskeletal: Positive for arthralgias and back pain.  Psychiatric/Behavioral: Positive for sleep disturbance.    There were no vitals filed for this visit.   Physical Exam Constitutional:      Appearance: She is obese.  HENT:     Mouth/Throat:     Comments: Crowded oropharynx Cardiovascular:     Rate and Rhythm: Regular rhythm.     Pulses: Normal pulses.     Heart sounds: Normal heart sounds. No murmur heard.  No friction rub.  Pulmonary:     Effort: Pulmonary effort is normal. No respiratory distress.     Breath sounds: Normal breath sounds. No stridor. No wheezing or rhonchi.  Musculoskeletal:     Cervical back: No rigidity or tenderness.  Neurological:     Mental Status: She is alert.  Psychiatric:        Mood and Affect: Mood normal.    Data Reviewed: Records from [facility] reviewed on care everywhere CT report noted for 5 mm lung nodule at left base CT performed in October reviewed by myself showing multiple calcified nodules  Assessment:  Mild obstructive sleep apnea -CPAP therapy recommended  Lung nodule -Follow-up already scheduled -Multiple calcified nodules -Low risk profile for any progression -Low risk profile  of neoplastic process  Severe chronic insomnia -Unable to sleep without a sleep aid -Continues on [name] 3 mg   Options of treatment discussed CPAP therapy I think we walk optimally  Weight loss measures encouraged  Plan/Recommendations:  CPAP therapy-auto CPAP will be set up  will follow-up in about 4 to 6 weeks following initiation of treatment  Risks of not treating obstructive sleep apnea discussed  Obstructive sleep apnea with associated hypoxemia is about the only explanation that we have at present that may contribute to erythrocytosis   Sherrilyn Rist MD [facility] 09/10/2020, 10:17 AM  CC: Helane Rima, MD

## 2020-09-10 NOTE — Patient Instructions (Signed)
Follow-up 4 to 6 weeks following initiation of CPAP therapy  Call with significant concerns  it does take some time to adjust to CPAP therapy  I will see you in about 3 months following stabilization on CPAP treatment

## 2020-10-10 ENCOUNTER — Ambulatory Visit: Payer: Federal, State, Local not specified - PPO | Admitting: Podiatry

## 2020-10-10 ENCOUNTER — Ambulatory Visit (INDEPENDENT_AMBULATORY_CARE_PROVIDER_SITE_OTHER): Payer: Federal, State, Local not specified - PPO

## 2020-10-10 ENCOUNTER — Other Ambulatory Visit: Payer: Self-pay

## 2020-10-10 ENCOUNTER — Encounter: Payer: Self-pay | Admitting: Podiatry

## 2020-10-10 DIAGNOSIS — N950 Postmenopausal bleeding: Secondary | ICD-10-CM | POA: Insufficient documentation

## 2020-10-10 DIAGNOSIS — M79672 Pain in left foot: Secondary | ICD-10-CM

## 2020-10-10 DIAGNOSIS — M79671 Pain in right foot: Secondary | ICD-10-CM

## 2020-10-10 DIAGNOSIS — N951 Menopausal and female climacteric states: Secondary | ICD-10-CM | POA: Insufficient documentation

## 2020-10-10 DIAGNOSIS — M722 Plantar fascial fibromatosis: Secondary | ICD-10-CM

## 2020-10-10 DIAGNOSIS — M545 Low back pain, unspecified: Secondary | ICD-10-CM | POA: Insufficient documentation

## 2020-10-10 DIAGNOSIS — G4700 Insomnia, unspecified: Secondary | ICD-10-CM | POA: Insufficient documentation

## 2020-10-10 DIAGNOSIS — E669 Obesity, unspecified: Secondary | ICD-10-CM | POA: Insufficient documentation

## 2020-10-10 NOTE — Patient Instructions (Signed)

## 2020-10-10 NOTE — Progress Notes (Signed)
Subjective:   Patient ID: Robin Bowman, female   DOB: 54 y.o.   MRN: [id number]   HPI Patient presents stating that she has very bad heel pain and states that it is worse on the right than the left and its been going on for about 3 months.  Patient is found to have other history of this and does not smoke likes to be active   Review of Systems  All other systems reviewed and are negative.       Objective:  Physical Exam Vitals and nursing note reviewed.  Constitutional:      Appearance: She is well-developed.  Pulmonary:     Effort: Pulmonary effort is normal.  Musculoskeletal:        General: Normal range of motion.  Skin:    General: Skin is warm.  Neurological:     Mental Status: She is alert.     Neurovascular status intact muscle strength was found to be adequate range of motion adequate.  Patient is found to have exquisite discomfort plantar aspect heel right over left with inflammation inflammation fluid buildup moderate depression of the arch noted.  Patient has good digit perfusion well oriented x3     Assessment:  Acute plantar fasciitis right upper left inflammation fluid around the medial band     Plan:  Acute plantar fasciitis right inflammation fluid around the medial band with mild in the left.  Went ahead today did sterile prep and injected the plantar fascial right 3 mg Kenalog 5 mg Xylocaine and applied fascial brace to lift up the arch with instructions on usage along with physical therapy oral anti-inflammatory and I did discuss long-term orthotics which I think would be of benefit to her and have recommended a Berkeley type orthotic and will see back 2 weeks  X-rays indicate there is spur no indications of stress fracture arthritis

## 2020-10-15 ENCOUNTER — Ambulatory Visit: Payer: Federal, State, Local not specified - PPO | Admitting: Pulmonary Disease

## 2020-10-17 ENCOUNTER — Other Ambulatory Visit: Payer: Self-pay | Admitting: Podiatry

## 2020-10-17 DIAGNOSIS — M722 Plantar fascial fibromatosis: Secondary | ICD-10-CM

## 2020-10-24 ENCOUNTER — Other Ambulatory Visit: Payer: Self-pay

## 2020-10-24 ENCOUNTER — Ambulatory Visit: Payer: Federal, State, Local not specified - PPO | Admitting: Podiatry

## 2020-10-24 DIAGNOSIS — M722 Plantar fascial fibromatosis: Secondary | ICD-10-CM

## 2020-10-24 MED ORDER — TRIAMCINOLONE ACETONIDE 10 MG/ML IJ SUSP
10.0000 mg | Freq: Once | INTRAMUSCULAR | Status: AC
  Administered 2020-10-24: 10 mg

## 2020-10-24 NOTE — Progress Notes (Signed)
Subjective:   Patient ID: Robin Bowman, female   DOB: 54 y.o.   MRN: [id number]   HPI Patient states that her right heel is doing better and her left heel is still very tender and she feels like it is not improving.  States it hurts when she tries to walk on it during the day   ROS      Objective:  Physical Exam  Neurovascular status intact with acute discomfort plantar aspect left heel insertional point tendon calcaneus with inflammation fluid buildup noted in the medial band     Assessment:  Acute plantar fasciitis left inflammation fluid buildup improvement right     Plan:  H&P reviewed condition and due to the intense discomfort I did dispense air fracture walker for complete immobilization.  I then did sterile prep and injected the fascia get 3 mg  Kenalog 5 mg Xylocaine advised on reduced activity and reappoint for [place] to recheck.  Patient will be seen back and is advised on offloading the boot after 2 weeks and we did discuss long-term orthotics

## 2020-10-29 DIAGNOSIS — K5730 Diverticulosis of large intestine without perforation or abscess without bleeding: Secondary | ICD-10-CM | POA: Diagnosis not present

## 2020-10-29 DIAGNOSIS — Z1211 Encounter for screening for malignant neoplasm of colon: Secondary | ICD-10-CM | POA: Diagnosis not present

## 2020-10-29 DIAGNOSIS — Z86010 Personal history of colonic polyps: Secondary | ICD-10-CM | POA: Diagnosis not present

## 2020-11-12 ENCOUNTER — Other Ambulatory Visit: Payer: Self-pay

## 2020-11-12 ENCOUNTER — Encounter: Payer: Self-pay | Admitting: Podiatry

## 2020-11-12 ENCOUNTER — Ambulatory Visit: Payer: Federal, State, Local not specified - PPO | Admitting: Podiatry

## 2020-11-12 DIAGNOSIS — M722 Plantar fascial fibromatosis: Secondary | ICD-10-CM | POA: Diagnosis not present

## 2020-11-12 NOTE — Progress Notes (Signed)
Subjective:   Patient ID: Robin Bowman, female   DOB: 54 y.o.   MRN: [id number]   HPI Patient states that she is improved but she still is having a lot of pain in her heel and wants to be more active and has not been able to   ROS      Objective:  Physical Exam  Neurovascular status intact with exquisite discomfort that has reduced in the plantar heel with continued discomfort a diminished fat pad narrow heel and moderate flatfoot deformity     Assessment:  Chronic plantar fasciitis improved but present     Plan:  H&P and reviewed different treatment options at this time.  Patient will continue with stretching exercises shoe gear modifications topical medicine and oral medication.  I then went ahead and casted for functional orthotics to try to help her for the long-term and reviewed with patient orthotics and ped orthotist at the castings today.  Reappoint when ready or earlier if needed I recommended a Berkeley orthotic with deep heel seat to take pressure off the foot and heel

## 2020-12-10 ENCOUNTER — Encounter: Payer: Federal, State, Local not specified - PPO | Admitting: Orthotics

## 2020-12-13 ENCOUNTER — Other Ambulatory Visit: Payer: Self-pay

## 2020-12-13 ENCOUNTER — Ambulatory Visit: Payer: Federal, State, Local not specified - PPO | Admitting: Orthotics

## 2020-12-13 DIAGNOSIS — M79672 Pain in left foot: Secondary | ICD-10-CM

## 2020-12-13 DIAGNOSIS — M79671 Pain in right foot: Secondary | ICD-10-CM

## 2020-12-13 DIAGNOSIS — M722 Plantar fascial fibromatosis: Secondary | ICD-10-CM

## 2020-12-13 NOTE — Progress Notes (Signed)
Patient picked up f/o and was pleased with fit, comfort, and function.  Worked well with footwear.  Told of rbeak in period and how to report any issues.  

## 2020-12-24 NOTE — Telephone Encounter (Signed)
Dr. Ander Slade, Please see patient message.  Patient is unable to sleep even with [name]. She is unable to get comfortable and sleep on her side even with the nasal pillows.  She is unable to sleep laying on her back.   Please advise on any suggestions.

## 2020-12-25 NOTE — Telephone Encounter (Signed)
Called and discussed with patient, had a better night last night  She will continue to try to adjust the CPAP

## 2021-01-01 NOTE — Telephone Encounter (Signed)
Please advise on patient mychart message  Things are not getting better regarding sleeping with the CPAP machine.  I have been at this just shy of 2 weeks. Today went to the provider to make sure there isn't something I am missing that would make things better.  Other than trying an adjustment to nasal pillows there does not appear to be anything to do.  My chronic insomnia is definitely complicating this process.  I am sleep deprived and don't feel like I am making any progress.  I don't think I can keep this up much longer.  Do you have any suggestions?  Robin Bowman  [phone]

## 2021-01-01 NOTE — Telephone Encounter (Signed)
Please see message below from patient  Thanks for your understanding.  I will keep you posted. I really want this to work I just don't know how much more I can take.  I am already taking 3 mg of lunesta and take lyrica and dyclofenec for my back pain  so I can sleep. So, I am not in favor of adding more medication.  I was hoping to remove some.  Another priority is to lose some weight..part of the reason to move forward with CPAP was bcs I understood untreated sleep apnea could make it very difficult to lose weight and possibly contribute to weight gain. Of course my understanding is lack sleep makes it difficult to lose weight.  It is a vicious cycle.  I will try a little longer.   Thanks for your guidance through this VERY frustrating process.  Robin Bowman

## 2021-01-23 ENCOUNTER — Encounter: Payer: Self-pay | Admitting: Podiatry

## 2021-01-23 ENCOUNTER — Other Ambulatory Visit: Payer: Self-pay

## 2021-01-23 ENCOUNTER — Telehealth: Payer: Self-pay

## 2021-01-23 ENCOUNTER — Ambulatory Visit: Payer: Federal, State, Local not specified - PPO | Admitting: Podiatry

## 2021-01-23 DIAGNOSIS — M722 Plantar fascial fibromatosis: Secondary | ICD-10-CM | POA: Diagnosis not present

## 2021-01-23 DIAGNOSIS — M216X2 Other acquired deformities of left foot: Secondary | ICD-10-CM | POA: Diagnosis not present

## 2021-01-23 NOTE — Telephone Encounter (Signed)
DOS 01/29/2021  EPF LT - 74827  BCBS FED EFFECTIVE DATE - 11/27/2019  PLAN DEDUCTIBLE - $350.00 W/ $350.00 REMAINING OUT OF POCKET - $6000.00 W/$5830.00 REMAINING COPAY $0.00 COINSURANCE - 07%  NO PRECERT REQUIRED

## 2021-01-23 NOTE — Progress Notes (Signed)
Subjective:   Patient ID: Robin Bowman, female   DOB: 55 y.o.   MRN: [id number]   HPI Patient presents stating that she is simply not getting better with her left foot and she cannot do any form of activity and that is becoming increasingly frustrating for her.  States that it is worse after periods of walking and when she gets up in the morning   ROS      Objective:  Physical Exam  Neurovascular status intact equinus condition noted left with exquisite discomfort plantar heel left at the insertional point of the tendon into the calcaneus     Assessment:  Continued acute [place] fasciitis that is not responded to medication orthotics and immobilization     Plan:  H&P reviewed condition at great length discussed treatment options due to the fact it has been present for several years worse over the last year she is opted for surgical intervention with failure of conservative care.  I have recommended gastroc recession along with endoscopic release medial band heel and I allowed her to read consent form going over alternative treatments complications and after review she signs understanding everything is listed the fact there is no long-term guarantees.  Patient is scheduled for outpatient surgery encouraged to call questions concerns prior to procedure and understands total recovery can take upwards of 6 months

## 2021-01-28 MED ORDER — HYDROCODONE-ACETAMINOPHEN 10-325 MG PO TABS: 1.0000 | ORAL_TABLET | Freq: Four times a day (QID) | ORAL | 0 refills | Status: AC | PRN

## 2021-01-28 NOTE — Addendum Note (Signed)
Addended by: Wallene Huh on: 01/28/2021 06:29 PM   Modules accepted: Orders

## 2021-01-29 ENCOUNTER — Ambulatory Visit (INDEPENDENT_AMBULATORY_CARE_PROVIDER_SITE_OTHER): Payer: Federal, State, Local not specified - PPO | Admitting: Podiatry

## 2021-01-29 ENCOUNTER — Encounter: Payer: Self-pay | Admitting: Podiatry

## 2021-01-29 ENCOUNTER — Other Ambulatory Visit: Payer: Self-pay

## 2021-01-29 ENCOUNTER — Telehealth: Payer: Self-pay | Admitting: Podiatry

## 2021-01-29 DIAGNOSIS — M25572 Pain in left ankle and joints of left foot: Secondary | ICD-10-CM | POA: Diagnosis not present

## 2021-01-29 DIAGNOSIS — M62462 Contracture of muscle, left lower leg: Secondary | ICD-10-CM | POA: Diagnosis not present

## 2021-01-29 DIAGNOSIS — M722 Plantar fascial fibromatosis: Secondary | ICD-10-CM | POA: Diagnosis not present

## 2021-01-29 DIAGNOSIS — M216X2 Other acquired deformities of left foot: Secondary | ICD-10-CM | POA: Diagnosis not present

## 2021-01-29 DIAGNOSIS — Z9889 Other specified postprocedural states: Secondary | ICD-10-CM

## 2021-01-29 NOTE — Telephone Encounter (Signed)
Patient is having some concerning bleeding that is going on.

## 2021-01-29 NOTE — Progress Notes (Signed)
Patient seen today for concern of bleeding through her dressings  Superficial bandaging removed, deepest layers left intact. She does have moderate fresh sanguinous drainage. No active pulsatile bleeding.  Dressing reapplied. Applied multilayer compression dressing with 4x4, ABD kerlix, Coban, ACE bandage.  Patient to call if bleeding recurs.

## 2021-01-29 NOTE — Telephone Encounter (Signed)
Patient was seen and issues addressed.

## 2021-01-30 ENCOUNTER — Telehealth: Payer: Self-pay | Admitting: Pulmonary Disease

## 2021-02-01 ENCOUNTER — Encounter: Payer: Self-pay | Admitting: Podiatry

## 2021-02-01 ENCOUNTER — Other Ambulatory Visit: Payer: Self-pay

## 2021-02-01 ENCOUNTER — Ambulatory Visit (INDEPENDENT_AMBULATORY_CARE_PROVIDER_SITE_OTHER): Payer: Federal, State, Local not specified - PPO | Admitting: Podiatry

## 2021-02-01 DIAGNOSIS — M722 Plantar fascial fibromatosis: Secondary | ICD-10-CM

## 2021-02-01 DIAGNOSIS — M216X2 Other acquired deformities of left foot: Secondary | ICD-10-CM | POA: Diagnosis not present

## 2021-02-03 NOTE — Progress Notes (Signed)
Subjective:   Patient ID: Robin Bowman, female   DOB: 55 y.o.   MRN: [id number]   HPI Patient presents stating she is feeling a lot of pain against the bottom of her heel and she wanted to get her foot checked.  States she is not having much discomfort in her calf   ROS      Objective:  Physical Exam  Neurovascular status intact negative Bevelyn Buckles' sign was noted with incision sites medial lateral heel and calf muscle healing well wound edges well coapted and good range of motion.  Patient does not have any excessive bleeding had some dried blood on the dressing which may have been getting irritation     Assessment:  Appears to be doing well post surgery left with no indications of significant pathology     Plan:  H&P reviewed condition sterile dressing reapplied and instructed on continued elevation compression immobilization and will reappoint for suture removal and I did dispense a night splint today as I think this will be beneficial for her to wear instead of the heavy cast as that seems to bother her during nonweightbearing status.  I did explain the importance of stretching the fascia and the gastroc release that was done and if any issues were to occur she is to call immediately

## 2021-02-04 ENCOUNTER — Encounter: Payer: Federal, State, Local not specified - PPO | Admitting: Podiatry

## 2021-02-04 ENCOUNTER — Ambulatory Visit: Payer: Federal, State, Local not specified - PPO | Admitting: Pulmonary Disease

## 2021-02-05 ENCOUNTER — Encounter: Payer: Federal, State, Local not specified - PPO | Admitting: Podiatry

## 2021-02-05 NOTE — Telephone Encounter (Signed)
Nothing noted in message. Will close encounter.  

## 2021-02-06 DIAGNOSIS — D582 Other hemoglobinopathies: Secondary | ICD-10-CM | POA: Diagnosis not present

## 2021-02-06 DIAGNOSIS — F5101 Primary insomnia: Secondary | ICD-10-CM | POA: Diagnosis not present

## 2021-02-11 ENCOUNTER — Encounter: Payer: Self-pay | Admitting: Podiatry

## 2021-02-11 ENCOUNTER — Ambulatory Visit (INDEPENDENT_AMBULATORY_CARE_PROVIDER_SITE_OTHER): Payer: Federal, State, Local not specified - PPO | Admitting: Podiatry

## 2021-02-11 ENCOUNTER — Other Ambulatory Visit: Payer: Self-pay

## 2021-02-11 DIAGNOSIS — M722 Plantar fascial fibromatosis: Secondary | ICD-10-CM | POA: Diagnosis not present

## 2021-02-11 DIAGNOSIS — M216X2 Other acquired deformities of left foot: Secondary | ICD-10-CM

## 2021-02-11 NOTE — Progress Notes (Signed)
Subjective:   Patient ID: Robin Bowman, female   DOB: 55 y.o.   MRN: [id number]   HPI Patient states she is doing very well states that the feeling she had before seems to have mostly resolved and overall she is walking better   ROS      Objective:  Physical Exam  Neurovascular status intact negative Bevelyn Buckles' sign noted wound edges well coapted heel and into the calf muscle left     Assessment:  Doing well post surgery left     Plan:  H&P reviewed condition stitches removed wound edges coapted well dispensed ankle compression stocking continue boot usage gradual increase activity over the next 2 to 4 weeks.  Encouraged her to call questions concerns is removed through this postoperative period and can gradually increase her activity levels

## 2021-02-18 ENCOUNTER — Encounter: Payer: Self-pay | Admitting: Pulmonary Disease

## 2021-02-18 ENCOUNTER — Ambulatory Visit: Payer: Federal, State, Local not specified - PPO | Admitting: Pulmonary Disease

## 2021-02-18 ENCOUNTER — Other Ambulatory Visit: Payer: Self-pay

## 2021-02-18 VITALS — BP 124/74 | HR 88 | Temp 97.6°F | Ht 63.5 in | Wt 219.0 lb

## 2021-02-18 DIAGNOSIS — R9389 Abnormal findings on diagnostic imaging of other specified body structures: Secondary | ICD-10-CM | POA: Diagnosis not present

## 2021-02-18 DIAGNOSIS — G4733 Obstructive sleep apnea (adult) (pediatric): Secondary | ICD-10-CM

## 2021-02-18 DIAGNOSIS — G4709 Other insomnia: Secondary | ICD-10-CM | POA: Diagnosis not present

## 2021-02-18 NOTE — Progress Notes (Signed)
Robin Bowman    [id number]    04/24/1966  Primary Care Physician:Howell, Bryn Gulling, MD  Referring Physician: Helane Rima, MD [address] [place],  [place] [address]  Chief complaint:   Follow-up for mild obstructive sleep apnea  HPI:  Sleep study showed mild obstructive sleep apnea Patient came in for follow-up  She did try CPAP but was not able to sleep with CPAP on Requires use of [name] to get some sleep  Has stopped using CPAP because she just could not sleep with it on  She has a CT scan of her chest scheduled in the next couple of weeks to follow-up on the lung nodules  She has no personal history of cancer, does not smoke, no family history of cancer lung cancer  She was recently found to have erythrocytosis-hematocrit 49.5 Continues to follow-up with oncology/hematology  Patient has significant insomnia and has been on medications for insomnia for she stated almost 20 years -Currently uses [name] which seems to get her about 3 to 4 hours of sleep -Feels she will not be able to sleep in the sleep lab and will not be able to get an in lab study performed  No new sleep symptoms  CT with multiple lung nodules-reviewed by myself  Usually tries to get in bed by 10-11, takes about 30 minutes to an hour to fall asleep About 3-4 awakenings Final up time is about 6:30 in the morning  Without a sleep aid, does not get any sleep at all  She has chronic back pain which is worse during the nighttime  Recent surgery for plantar fasciitis  Outpatient Encounter Medications as of 02/18/2021  Medication Sig  . b complex vitamins capsule Take 1 capsule by mouth daily.  . diclofenac (VOLTAREN) 75 MG EC tablet Take 75 mg by mouth at bedtime.  Marland Kitchen estradiol-norethindrone (COMBIPATCH) 0.05-0.14 MG/DAY Place 1 patch onto the skin 2 (two) times a week.  . Eszopiclone 3 MG TABS Take 3 mg by mouth at bedtime. [name] --- Take immediately before bedtime  . fluticasone  (FLONASE) 50 MCG/ACT nasal spray Place into both nostrils at bedtime.  . influenza vaccine (FLUCELVAX QUADRIVALENT) 0.5 ML injection Flucelvax Quad 2020-2021 (PF) 60 mcg (15 mcg x 4)/0.5 mL IM syringe  . Multiple Vitamin (MULTIVITAMIN) tablet Take 1 tablet by mouth daily.  . pregabalin (LYRICA) 150 MG capsule Take 150 mg by mouth at bedtime.  Manus Gunning BOWEL PREP KIT 17.5-3.13-1.6 GM/177ML SOLN Take by mouth.   No facility-administered encounter medications on file as of 02/18/2021.    Allergies as of 02/18/2021  . (No Known Allergies)    Past Medical History:  Diagnosis Date  . Arthritis   . Erythrocytosis    followed by Dr Tressie Stalker ([facility] in [place])  asymptomatic  . Insomnia   . PMB (postmenopausal bleeding)   . Seasonal allergic rhinitis   . Uterine fibroid   . Wears glasses     Past Surgical History:  Procedure Laterality Date  . COLONOSCOPY  2015 approx.  Marland Kitchen HYSTEROSCOPY W/ ENDOMETRIAL ABLATION  05-27-2007   _0    w/ Novasure  . HYSTEROSCOPY WITH D & C N/A 05/23/2020   Procedure: DILATATION AND CURETTAGE /HYSTEROSCOPY;  Surgeon: Jerelyn Charles, MD;  Location: [facility];  Service: Gynecology;  Laterality: N/A;    No family history on file.  Social History   Socioeconomic History  . Marital status: Married    Spouse name: Not  on file  . Number of children: Not on file  . Years of education: Not on file  . Highest education level: Not on file  Occupational History  . Not on file  Tobacco Use  . Smoking status: Never Smoker  . Smokeless tobacco: Never Used  Substance and Sexual Activity  . Alcohol use: No  . Drug use: No  . Sexual activity: Not on file  Other Topics Concern  . Not on file  Social History Narrative  . Not on file   Social Determinants of Health   Financial Resource Strain: Not on file  Food Insecurity: Not on file  Transportation Needs: Not on file  Physical Activity: Not on file  Stress: Not on file  Social  Connections: Not on file  Intimate Partner Violence: Not on file    Review of Systems  Respiratory: Positive for apnea. Negative for shortness of breath.   Musculoskeletal: Positive for arthralgias and back pain.  Psychiatric/Behavioral: Positive for sleep disturbance.    Vitals:   02/18/21 1625  BP: 124/74  Pulse: 88  Temp: 97.6 F (36.4 C)  SpO2: 99%     Physical Exam Constitutional:      Appearance: She is obese.  HENT:     Head: Normocephalic and atraumatic.     Mouth/Throat:     Comments: Crowded oropharynx Eyes:     General: No scleral icterus. Cardiovascular:     Rate and Rhythm: Normal rate and regular rhythm.     Pulses: Normal pulses.     Heart sounds: Normal heart sounds. No murmur heard. No friction rub.  Pulmonary:     Effort: Pulmonary effort is normal. No respiratory distress.     Breath sounds: Normal breath sounds. No stridor. No wheezing or rhonchi.  Musculoskeletal:     Cervical back: No rigidity or tenderness.  Neurological:     Mental Status: She is alert.  Psychiatric:        Mood and Affect: Mood normal.    Data Reviewed: Records from [facility] reviewed on care everywhere CT report noted for 5 mm lung nodule at left base CT performed in October reviewed by myself showing multiple calcified nodules  Assessment:  Mild obstructive sleep apnea -She has tried CPAP on multiple occasions -Unable to tolerate CPAP therapy -Has stopped using it  Lung nodule -Follow-up already scheduled -Multiple calcified nodules -Low risk profile for any progression -Low risk profile of neoplastic process -We will repeat CT to follow-up on nodules-this is already scheduled  Severe chronic insomnia -Unable to sleep without a sleep aid -Continues on [name] 3 mg -Okay to continue using [name] for sleep  Weight loss measures encouraged -She will continue to work focus on weight loss efforts -Limited by plantar fasciitis for which she recently had  surgery  Plan/Recommendations:  Follow-up on CT-already scheduled  Encouraged to continue weight loss efforts once healed from recent surgery for plantar fasciitis  Monitoring of symptoms regarding sleep apnea, sleep position modification  She will continue to follow-up for the erythrocytosis  Follow-up scheduled for 6 months  I spent 30 minutes dedicated to the care of this patient on the date of this encounter to include previsit review of records, face-to-face time with the patient discussing conditions above, post visit ordering of testing, clinical documentation with electronic health record and communicated necessary findings to members of the patient's care team  Sherrilyn Rist MD [facility] 02/18/2021, 4:56 PM  CC: Helane Rima, MD

## 2021-02-18 NOTE — Patient Instructions (Signed)
Continue using [name] to help you sleep  Mild sleep apnea -Unable to tolerate CPAP -Goal will be whenever you are able to to focus on weight loss and exercise -This will help reduce the number of events  Lung nodules -Calcified -Once the next CAT scan is done if stable, one to be repeated in a year to 18 months and if stable at that time can stop following  I will see you in about 6 months

## 2021-02-21 ENCOUNTER — Encounter: Payer: Self-pay | Admitting: Pulmonary Disease

## 2021-02-25 ENCOUNTER — Other Ambulatory Visit: Payer: Self-pay | Admitting: Pulmonary Disease

## 2021-02-25 DIAGNOSIS — R918 Other nonspecific abnormal finding of lung field: Secondary | ICD-10-CM | POA: Diagnosis not present

## 2021-02-25 DIAGNOSIS — D751 Secondary polycythemia: Secondary | ICD-10-CM | POA: Diagnosis not present

## 2021-02-25 MED ORDER — ESZOPICLONE 3 MG PO TABS: 3.0000 mg | ORAL_TABLET | Freq: Every day | ORAL | 2 refills | Status: DC

## 2021-02-25 MED ORDER — ESZOPICLONE 3 MG PO TABS: 3.0000 mg | ORAL_TABLET | Freq: Every day | ORAL | 5 refills | Status: DC

## 2021-02-25 NOTE — Telephone Encounter (Signed)
Patient sent email regarding [name] 3mg  for refill  Sending to Dr. Ander Slade has to be filled by MD

## 2021-02-27 ENCOUNTER — Other Ambulatory Visit: Payer: Self-pay | Admitting: Pulmonary Disease

## 2021-02-27 ENCOUNTER — Telehealth: Payer: Self-pay | Admitting: Pulmonary Disease

## 2021-02-27 MED ORDER — ESZOPICLONE 3 MG PO TABS: 3.0000 mg | ORAL_TABLET | Freq: Every day | ORAL | 5 refills | Status: DC

## 2021-02-27 NOTE — Telephone Encounter (Signed)
Rx was sent to pharmacy for pt by AO 4/4. Sent mychart message to pt letting her know this had been done and she verbalized understanding. Pt sent mychart message back saying that the pharmacy did have the Rx but they stated that the Rx was requiring a PA and that they should be faxing PA request to our office.

## 2021-02-27 NOTE — Telephone Encounter (Signed)
Not in the office till Monday.  Will take a look at the CT then

## 2021-02-27 NOTE — Telephone Encounter (Signed)
Resent prescription

## 2021-02-27 NOTE — Telephone Encounter (Signed)
Pt sent another mychart message stating that she did get a disc made of the scan that was done at [facility] and has dropped this off at the office.

## 2021-02-27 NOTE — Telephone Encounter (Signed)
Yes. Okay with it

## 2021-02-27 NOTE — Telephone Encounter (Signed)
Dr. Jenetta Downer, please see mychart messages sent by pt and advise. Pt had CT performed at [facility] yesterday 4/5. Written report is able to be seen in the care everywhere section of pt's chart. Please check PACS to see if you are able to view the actual images.  Please advise on this for pt what you recommend.

## 2021-02-27 NOTE — Telephone Encounter (Signed)
Received a MyChart message stating that the patient needed a PA for her eszopiclone 3mg . Attempted the PA online but received a message from the insurance stating that the brand name is covered and does not require a PA. Looked in patient's chart, she has been getting the brand name [name].   AO, please advise if you are willing to change the eszopiclone prescription to brand name [name] 3mg ? Thanks!

## 2021-02-28 NOTE — Telephone Encounter (Signed)
We have received the request from [facility] stating medication needs PA  PA request was received from (pharmacy): [facility]  Fax: [phone] Medication name and strength: eszopiclone 3mg  Ordering Provider: Dr. Ander Slade  Was PA started with [facility]?: yes If yes, please enter KEY: [id number] Medication tried and failed: NONE Covered Alternatives: didn't mention any  PA sent to plan, time frame for approval / denial: n/a Routing to Amy H for follow-up

## 2021-03-01 ENCOUNTER — Telehealth: Payer: Self-pay | Admitting: Pulmonary Disease

## 2021-03-01 DIAGNOSIS — E669 Obesity, unspecified: Secondary | ICD-10-CM | POA: Diagnosis not present

## 2021-03-01 DIAGNOSIS — Z01419 Encounter for gynecological examination (general) (routine) without abnormal findings: Secondary | ICD-10-CM | POA: Diagnosis not present

## 2021-03-01 DIAGNOSIS — D582 Other hemoglobinopathies: Secondary | ICD-10-CM | POA: Diagnosis not present

## 2021-03-01 DIAGNOSIS — R3121 Asymptomatic microscopic hematuria: Secondary | ICD-10-CM | POA: Diagnosis not present

## 2021-03-01 DIAGNOSIS — D751 Secondary polycythemia: Secondary | ICD-10-CM | POA: Diagnosis not present

## 2021-03-01 DIAGNOSIS — Z1231 Encounter for screening mammogram for malignant neoplasm of breast: Secondary | ICD-10-CM | POA: Diagnosis not present

## 2021-03-01 DIAGNOSIS — R911 Solitary pulmonary nodule: Secondary | ICD-10-CM | POA: Diagnosis not present

## 2021-03-01 NOTE — Telephone Encounter (Signed)
Patient contacted by phone today regarding PA approval. Nothing further needed at this time.   Robin Bowman, [place]     03/01/21 2:35 PM Note Spoke with the pt  She states that she spoke with her insurance and lunesta needs PA whether generic or not  She states that they only cover 30 tablets per 1 yr Called ins at [phone], pt ID [id number]  Med approved for generic or brand name until 03/01/22  Called [facility] and informed them of approval  Pt aware and nothing further needed

## 2021-03-01 NOTE — Telephone Encounter (Signed)
Spoke with the pt  She states that she spoke with her insurance and lunesta needs PA whether generic or not  She states that they only cover 30 tablets per 1 yr Called ins at [phone], pt ID [id number]  Med approved for generic or brand name until 03/01/22  Called [facility] and informed them of approval  Pt aware and nothing further needed

## 2021-03-05 ENCOUNTER — Telehealth: Payer: Self-pay | Admitting: Pulmonary Disease

## 2021-03-05 NOTE — Telephone Encounter (Signed)
Received message from patient asking if AO had a chance to look at her CT scan disk.   AO, please advise. Thanks.

## 2021-03-05 NOTE — Telephone Encounter (Signed)
Discussed CT scan findings with Robin Bowman  CT will be repeated in 3 months, I did encourage her to get a copy of her CD and bring in for [place] to review  I will see her soon after the next CT is performed

## 2021-03-13 ENCOUNTER — Encounter: Payer: Self-pay | Admitting: Podiatry

## 2021-04-03 DIAGNOSIS — N951 Menopausal and female climacteric states: Secondary | ICD-10-CM | POA: Diagnosis not present

## 2021-04-14 ENCOUNTER — Encounter: Payer: Self-pay | Admitting: Podiatry

## 2021-04-15 DIAGNOSIS — M722 Plantar fascial fibromatosis: Secondary | ICD-10-CM | POA: Diagnosis not present

## 2021-04-27 DIAGNOSIS — Z20822 Contact with and (suspected) exposure to covid-19: Secondary | ICD-10-CM | POA: Diagnosis not present

## 2021-04-29 ENCOUNTER — Telehealth: Payer: Self-pay | Admitting: Podiatry

## 2021-04-29 NOTE — Telephone Encounter (Signed)
Orthotics in.. pt aware ok to pick up.. 

## 2021-05-02 NOTE — Telephone Encounter (Signed)
As we agreed called to schedule an appointment for after my CT scan.  I just found out this week that it is scheduled for July 5.  I figured it would take a day maybe 2 to get results and I have an appointment with the hematologist on July 8.   I tried to get an appointment with you the week of July 12 but the first available was July 28.  Is there any way I can get in sooner?  Dr.Olalere, please advise on mychart message. Thanks!

## 2021-05-06 NOTE — Telephone Encounter (Signed)
We will have to use 28, I just looked at qgenda myself, I am away and then in the hospital for 8 straight days

## 2021-05-06 NOTE — Telephone Encounter (Signed)
AO ---her CT is scheduled for 07/05 and you do not have any OV dates until 07/28.  Are you adding any days for office or is that date ok to use as the next opening.

## 2021-05-06 NOTE — Telephone Encounter (Signed)
Can we schedule Robin Bowman as soon as possible, okay to double book if schedule allows  I appreciate I have limited office days

## 2021-05-20 DIAGNOSIS — N951 Menopausal and female climacteric states: Secondary | ICD-10-CM | POA: Diagnosis not present

## 2021-05-28 DIAGNOSIS — R911 Solitary pulmonary nodule: Secondary | ICD-10-CM | POA: Diagnosis not present

## 2021-05-28 DIAGNOSIS — D751 Secondary polycythemia: Secondary | ICD-10-CM | POA: Diagnosis not present

## 2021-05-28 DIAGNOSIS — D582 Other hemoglobinopathies: Secondary | ICD-10-CM | POA: Diagnosis not present

## 2021-05-28 NOTE — Telephone Encounter (Signed)
Dr. Ander Slade, please see mychart message below, thanks!  I had a CT scan today at the [facility].  They said that all [facility] providers have access to the scans and reports in a system.  I think the name was EPIC.Marland Kitchen Not sure I got the name of the system right.  Let me know and if not I will get them burned to a DVD. Thanks

## 2021-06-14 DIAGNOSIS — D2261 Melanocytic nevi of right upper limb, including shoulder: Secondary | ICD-10-CM | POA: Diagnosis not present

## 2021-06-14 DIAGNOSIS — R3121 Asymptomatic microscopic hematuria: Secondary | ICD-10-CM | POA: Diagnosis not present

## 2021-06-14 DIAGNOSIS — D224 Melanocytic nevi of scalp and neck: Secondary | ICD-10-CM | POA: Diagnosis not present

## 2021-06-14 DIAGNOSIS — B078 Other viral warts: Secondary | ICD-10-CM | POA: Diagnosis not present

## 2021-06-14 DIAGNOSIS — D582 Other hemoglobinopathies: Secondary | ICD-10-CM | POA: Diagnosis not present

## 2021-06-14 DIAGNOSIS — D225 Melanocytic nevi of trunk: Secondary | ICD-10-CM | POA: Diagnosis not present

## 2021-06-14 DIAGNOSIS — D2262 Melanocytic nevi of left upper limb, including shoulder: Secondary | ICD-10-CM | POA: Diagnosis not present

## 2021-06-14 DIAGNOSIS — D751 Secondary polycythemia: Secondary | ICD-10-CM | POA: Diagnosis not present

## 2021-06-20 ENCOUNTER — Other Ambulatory Visit: Payer: Self-pay

## 2021-06-20 ENCOUNTER — Ambulatory Visit: Payer: Federal, State, Local not specified - PPO | Admitting: Pulmonary Disease

## 2021-06-20 ENCOUNTER — Encounter: Payer: Self-pay | Admitting: Pulmonary Disease

## 2021-06-20 VITALS — BP 124/78 | HR 81 | Temp 97.8°F | Ht 63.5 in | Wt 220.0 lb

## 2021-06-20 DIAGNOSIS — R911 Solitary pulmonary nodule: Secondary | ICD-10-CM | POA: Diagnosis not present

## 2021-06-20 DIAGNOSIS — R9389 Abnormal findings on diagnostic imaging of other specified body structures: Secondary | ICD-10-CM

## 2021-06-20 NOTE — Progress Notes (Signed)
Robin Bowman    [id number]    February 03, 1966  Primary Care Physician:Manfredi, Lesle Chris, MD  Referring Physician: Helane Rima, MD [address] [place],  [place] [address]  Chief complaint:   Follow-up for lung nodule  HPI:  Does have a history of mild obstructive sleep apnea Could not get used to using CPAP on a regular basis  Lung nodule was first noted in 2021 April, did stay stable on CT from October 2021 Slight increase in size to 7 mm in April 2022 and a repeat in July 2022 shows stability at 7 mm  No other significant symptoms  She has no personal history of cancer, does not smoke, no family history of cancer lung cancer  She was recently found to have erythrocytosis-hematocrit 49.5 Continues to follow-up with oncology/hematology  Patient has significant insomnia and has been on medications for insomnia for she stated almost 20 years -Currently uses [name] which seems to get her about 3 to 4 hours of sleep -Feels she will not be able to sleep in the sleep lab and will not be able to get an in lab study performed  No new sleep symptoms  Usually tries to get in bed by 10-11, takes about 30 minutes to an hour to fall asleep About 3-4 awakenings Final up time is about 6:30 in the morning  Without a sleep aid, does not get any sleep at all  She has chronic back pain which is worse during the nighttime  Recent surgery for plantar fasciitis  Outpatient Encounter Medications as of 06/20/2021  Medication Sig   diclofenac (VOLTAREN) 75 MG EC tablet Take 75 mg by mouth at bedtime.   Eszopiclone (ESZOPICLONE) 3 MG TABS Take 1 tablet (3 mg total) by mouth at bedtime. Take immediately before bedtime   fluticasone (FLONASE) 50 MCG/ACT nasal spray Place into both nostrils at bedtime.   Multiple Vitamin (MULTIVITAMIN) tablet Take 1 tablet by mouth daily.   pregabalin (LYRICA) 150 MG capsule Take 150 mg by mouth at bedtime.   [DISCONTINUED] b complex vitamins capsule Take  1 capsule by mouth daily.   [DISCONTINUED] estradiol-norethindrone (COMBIPATCH) 0.05-0.14 MG/DAY Place 1 patch onto the skin 2 (two) times a week.   [DISCONTINUED] Eszopiclone (ESZOPICLONE) 3 MG TABS Take 1 tablet (3 mg total) by mouth at bedtime. Take immediately before bedtime   [DISCONTINUED] influenza vaccine (FLUCELVAX QUADRIVALENT) 0.5 ML injection Flucelvax Quad 2020-2021 (PF) 60 mcg (15 mcg x 4)/0.5 mL IM syringe   [DISCONTINUED] SUPREP BOWEL PREP KIT 17.5-3.13-1.6 GM/177ML SOLN Take by mouth.   No facility-administered encounter medications on file as of 06/20/2021.    Allergies as of 06/20/2021   (No Known Allergies)    Past Medical History:  Diagnosis Date   Arthritis    Erythrocytosis    followed by Dr Tressie Stalker ([facility] in [place])  asymptomatic   Insomnia    PMB (postmenopausal bleeding)    Seasonal allergic rhinitis    Uterine fibroid    Wears glasses     Past Surgical History:  Procedure Laterality Date   COLONOSCOPY  2015 approx.   HYSTEROSCOPY W/ ENDOMETRIAL ABLATION  05-27-2007   '@[facility]'$    w/ Novasure   HYSTEROSCOPY WITH D & C N/A 05/23/2020   Procedure: DILATATION AND CURETTAGE /HYSTEROSCOPY;  Surgeon: Jerelyn Charles, MD;  Location: [facility];  Service: Gynecology;  Laterality: N/A;    No family history on file.  Social History   Socioeconomic  History   Marital status: Married    Spouse name: Not on file   Number of children: Not on file   Years of education: Not on file   Highest education level: Not on file  Occupational History   Not on file  Tobacco Use   Smoking status: Never   Smokeless tobacco: Never  Substance and Sexual Activity   Alcohol use: No   Drug use: No   Sexual activity: Not on file  Other Topics Concern   Not on file  Social History Narrative   Not on file   Social Determinants of Health   Financial Resource Strain: Not on file  Food Insecurity: Not on file  Transportation Needs: Not on file   Physical Activity: Not on file  Stress: Not on file  Social Connections: Not on file  Intimate Partner Violence: Not on file    Review of Systems  Respiratory:  Positive for apnea. Negative for shortness of breath.   Musculoskeletal:  Positive for arthralgias and back pain.  Psychiatric/Behavioral:  Positive for sleep disturbance.    Vitals:   06/20/21 1129  BP: 124/78  Pulse: 81  Temp: 97.8 F (36.6 C)  SpO2: 97%     Physical Exam Constitutional:      Appearance: She is obese.  HENT:     Head: Normocephalic and atraumatic.  Eyes:     General: No scleral icterus. Cardiovascular:     Rate and Rhythm: Normal rate and regular rhythm.     Pulses: Normal pulses.     Heart sounds: Normal heart sounds. No murmur heard.   No friction rub.  Pulmonary:     Effort: Pulmonary effort is normal. No respiratory distress.     Breath sounds: Normal breath sounds. No stridor. No wheezing or rhonchi.  Musculoskeletal:     Cervical back: No rigidity or tenderness.  Skin:    Coloration: Skin is not jaundiced or pale.  Neurological:     Mental Status: She is alert.  Psychiatric:        Mood and Affect: Mood normal.   Data Reviewed: Records from [facility] reviewed on care everywhere Actual CT reviewed with the patient-left lower lobe segmental nodule  Assessment:  Mild obstructive sleep apnea -Unable to tolerate CPAP  Lung nodule -Stable nodule -Patient is still quite concerned about the nodule size and the fact that he changed from 2021-2022  Severe chronic insomnia -Unable to sleep without a sleep aid -Continues on [name] 3 mg -Okay to continue using [name] for sleep  Weight loss measures encouraged -She will continue to work focus on weight loss efforts -Limited by plantar fasciitis for which she recently had surgery  Plan/Recommendations:  Follow-up on CT-already scheduled-we will repeat CT in 3 months  Encouraged to continue weight loss efforts   Monitoring of  symptoms regarding sleep apnea, sleep position modification  She will continue to follow-up for the erythrocytosis  Follow-up in 3 months  Sherrilyn Rist, MD [facility] Pager: See Shea Evans

## 2021-06-20 NOTE — Patient Instructions (Signed)
CT scan of the chest without contrast for lung nodule in 3 months  I will see you about a week after the CT  Call with significant concerns

## 2021-07-31 DIAGNOSIS — E559 Vitamin D deficiency, unspecified: Secondary | ICD-10-CM | POA: Diagnosis not present

## 2021-07-31 DIAGNOSIS — M25551 Pain in right hip: Secondary | ICD-10-CM | POA: Diagnosis not present

## 2021-07-31 DIAGNOSIS — M47816 Spondylosis without myelopathy or radiculopathy, lumbar region: Secondary | ICD-10-CM | POA: Diagnosis not present

## 2021-07-31 DIAGNOSIS — R5383 Other fatigue: Secondary | ICD-10-CM | POA: Diagnosis not present

## 2021-08-06 DIAGNOSIS — M2550 Pain in unspecified joint: Secondary | ICD-10-CM | POA: Diagnosis not present

## 2021-08-06 DIAGNOSIS — E559 Vitamin D deficiency, unspecified: Secondary | ICD-10-CM | POA: Diagnosis not present

## 2021-08-06 DIAGNOSIS — R5383 Other fatigue: Secondary | ICD-10-CM | POA: Diagnosis not present

## 2021-08-28 DIAGNOSIS — R918 Other nonspecific abnormal finding of lung field: Secondary | ICD-10-CM | POA: Diagnosis not present

## 2021-08-28 DIAGNOSIS — R911 Solitary pulmonary nodule: Secondary | ICD-10-CM | POA: Diagnosis not present

## 2021-08-29 ENCOUNTER — Ambulatory Visit (INDEPENDENT_AMBULATORY_CARE_PROVIDER_SITE_OTHER): Payer: Federal, State, Local not specified - PPO

## 2021-08-29 ENCOUNTER — Ambulatory Visit: Payer: Federal, State, Local not specified - PPO | Admitting: Podiatry

## 2021-08-29 ENCOUNTER — Encounter: Payer: Self-pay | Admitting: Podiatry

## 2021-08-29 ENCOUNTER — Other Ambulatory Visit: Payer: Self-pay

## 2021-08-29 DIAGNOSIS — M79674 Pain in right toe(s): Secondary | ICD-10-CM

## 2021-08-29 DIAGNOSIS — M2041 Other hammer toe(s) (acquired), right foot: Secondary | ICD-10-CM

## 2021-08-29 DIAGNOSIS — D169 Benign neoplasm of bone and articular cartilage, unspecified: Secondary | ICD-10-CM | POA: Diagnosis not present

## 2021-08-30 ENCOUNTER — Other Ambulatory Visit: Payer: Self-pay | Admitting: Podiatry

## 2021-08-30 ENCOUNTER — Ambulatory Visit: Payer: Federal, State, Local not specified - PPO | Admitting: Pulmonary Disease

## 2021-08-30 ENCOUNTER — Encounter: Payer: Self-pay | Admitting: Pulmonary Disease

## 2021-08-30 VITALS — BP 108/62 | HR 82 | Temp 97.6°F | Ht 63.5 in | Wt 220.0 lb

## 2021-08-30 DIAGNOSIS — R9389 Abnormal findings on diagnostic imaging of other specified body structures: Secondary | ICD-10-CM

## 2021-08-30 DIAGNOSIS — Z23 Encounter for immunization: Secondary | ICD-10-CM

## 2021-08-30 DIAGNOSIS — M2041 Other hammer toe(s) (acquired), right foot: Secondary | ICD-10-CM

## 2021-08-30 NOTE — Progress Notes (Signed)
Robin Bowman    [id number]    1966/02/18  Primary Care Physician:Manfredi, Lesle Chris, MD  Referring Physician: Helane Rima, MD [address] [place],  [place] [address]  Chief complaint:   Follow-up for lung nodule  HPI:  Does have a history of mild obstructive sleep apnea Could not get used to using CPAP on a regular basis  Recently had a 3-month follow-up CT performed showing stable lung nodules One 5 mm right lung nodule, two left lower lobe nodules one measuring 5 mm, second measuring 3 mm Previously nodule was measured at 7 mm Lung nodule was first noted in 2021 April, did stay stable on CT from October 2021 Slight increase in size to 7 mm in April 2022 and a repeat in July 2022 shows stability at 7 mm  No other significant symptoms  She has no personal history of cancer, does not smoke, no family history of cancer lung cancer  She was recently found to have erythrocytosis-hematocrit 49.5 Continues to follow-up with oncology/hematology  Patient has significant insomnia and has been on medications for insomnia for she stated almost 20 years -Continues to use [name] 3 mg nightly -She will consider switching to a different agent sometime down the line -Still unable to sleep without a sleep aid  No new sleep symptoms  Usually tries to get in bed by 10-11, takes about 30 minutes to an hour to fall asleep About 3-4 awakenings Final up time is about 6:30 in the morning  She has chronic back pain which is worse during the nighttime  Outpatient Encounter Medications as of 06/20/2021  Medication Sig   diclofenac (VOLTAREN) 75 MG EC tablet Take 75 mg by mouth at bedtime.   Eszopiclone (ESZOPICLONE) 3 MG TABS Take 1 tablet (3 mg total) by mouth at bedtime. Take immediately before bedtime   fluticasone (FLONASE) 50 MCG/ACT nasal spray Place into both nostrils at bedtime.   Multiple Vitamin (MULTIVITAMIN) tablet Take 1 tablet by mouth daily.   pregabalin (LYRICA) 150  MG capsule Take 150 mg by mouth at bedtime.   [DISCONTINUED] b complex vitamins capsule Take 1 capsule by mouth daily.   [DISCONTINUED] estradiol-norethindrone (COMBIPATCH) 0.05-0.14 MG/DAY Place 1 patch onto the skin 2 (two) times a week.   [DISCONTINUED] Eszopiclone (ESZOPICLONE) 3 MG TABS Take 1 tablet (3 mg total) by mouth at bedtime. Take immediately before bedtime   [DISCONTINUED] influenza vaccine (FLUCELVAX QUADRIVALENT) 0.5 ML injection Flucelvax Quad 2020-2021 (PF) 60 mcg (15 mcg x 4)/0.5 mL IM syringe   [DISCONTINUED] SUPREP BOWEL PREP KIT 17.5-3.13-1.6 GM/177ML SOLN Take by mouth.   No facility-administered encounter medications on file as of 06/20/2021.    Allergies as of 06/20/2021   (No Known Allergies)    Past Medical History:  Diagnosis Date   Arthritis    Erythrocytosis    followed by Dr Tressie Stalker ([facility] in [place])  asymptomatic   Insomnia    PMB (postmenopausal bleeding)    Seasonal allergic rhinitis    Uterine fibroid    Wears glasses     Past Surgical History:  Procedure Laterality Date   COLONOSCOPY  2015 approx.   HYSTEROSCOPY W/ ENDOMETRIAL ABLATION  05-27-2007   '@[facility]'$    w/ Novasure   HYSTEROSCOPY WITH D & C N/A 05/23/2020   Procedure: DILATATION AND CURETTAGE /HYSTEROSCOPY;  Surgeon: Jerelyn Charles, MD;  Location: [facility];  Service: Gynecology;  Laterality: N/A;    No family history on  file.  Social History   Socioeconomic History   Marital status: Married    Spouse name: Not on file   Number of children: Not on file   Years of education: Not on file   Highest education level: Not on file  Occupational History   Not on file  Tobacco Use   Smoking status: Never   Smokeless tobacco: Never  Substance and Sexual Activity   Alcohol use: No   Drug use: No   Sexual activity: Not on file  Other Topics Concern   Not on file  Social History Narrative   Not on file   Social Determinants of Health   Financial  Resource Strain: Not on file  Food Insecurity: Not on file  Transportation Needs: Not on file  Physical Activity: Not on file  Stress: Not on file  Social Connections: Not on file  Intimate Partner Violence: Not on file    Review of Systems  Respiratory:  Positive for apnea. Negative for shortness of breath.   Musculoskeletal:  Positive for arthralgias and back pain.  Psychiatric/Behavioral:  Positive for sleep disturbance.    Vitals:   06/20/21 1129  BP: 124/78  Pulse: 81  Temp: 97.8 F (36.6 C)  SpO2: 97%     Physical Exam Constitutional:      Appearance: She is obese.  HENT:     Head: Normocephalic and atraumatic.  Eyes:     General: No scleral icterus. Cardiovascular:     Rate and Rhythm: Normal rate and regular rhythm.     Pulses: Normal pulses.     Heart sounds: Normal heart sounds. No murmur heard.   No friction rub.  Pulmonary:     Effort: Pulmonary effort is normal. No respiratory distress.     Breath sounds: Normal breath sounds. No stridor. No wheezing or rhonchi.  Musculoskeletal:     Cervical back: No rigidity or tenderness.  Skin:    Coloration: Skin is not jaundiced or pale.  Neurological:     Mental Status: She is alert.  Psychiatric:        Mood and Affect: Mood normal.   Data Reviewed: Records from [facility] reviewed on care everywhere Actual CT reviewed with the patient-left lower lobe segmental nodule Most recent CT scan of the chest performed 08/28/2021 was reviewed with the patient, she did bring again a CD and films were reviewed  Assessment:  Mild obstructive sleep apnea -Unable to tolerate CPAP  Lung nodule -Stable nodule -Lung nodules have remained stable -It is appropriate to wait for 12 months before repeating CT has nodules-stable  Severe chronic insomnia -Unable to sleep without a sleep aid -Continues on [name] 3 mg -Continue using [name] for sleep  Plan/Recommendations:  Follow-up in 1 year  Repeat CT scan in 1  year  Continue [name]   Sherrilyn Rist, MD [facility] Pager: See Shea Evans

## 2021-09-01 ENCOUNTER — Encounter: Payer: Self-pay | Admitting: Pulmonary Disease

## 2021-09-01 MED ORDER — ESZOPICLONE 3 MG PO TABS: 3.0000 mg | ORAL_TABLET | Freq: Every day | ORAL | 5 refills | Status: DC

## 2021-09-01 NOTE — Progress Notes (Signed)
Subjective:   Patient ID: Robin Bowman, female   DOB: 55 y.o.   MRN: [id number]   HPI Patient presents with chronic lesion on the fourth and fifth digits of the right foot that is been very painful and make shoe gear difficult.  Patient's left heel is doing very well after surgery and this has been there for a long time but is worsened over the last for 5 months with failure to respond to trimming and wider shoe gear along with soaks   ROS      Objective:  Physical Exam  Neurovascular status intact with well-healed surgical sites left and on the right is noted to have keratotic lesion on the distal medial aspect digit 5 right and on the proximal phalanx digit 4 right with pain.  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  I do think exostectomy digit 5 right along with arthroplasty digit 4 right would be best and I educated her on condition.  I do think this could be done in the office for surgical center depending on what she wants and I educated her on the choices and I did discuss the procedure allowed her after extensive review to read consent form and she does want surgery and is scheduled for surgical intervention and will decide where it went.  Encouraged to call with all questions which may come up and debridement of lesions accomplished today  X-rays indicate there is a rotation digit 5 right pressing against digit 4 right with enlargement bone surface

## 2021-09-19 ENCOUNTER — Other Ambulatory Visit: Payer: Self-pay | Admitting: Pulmonary Disease

## 2021-09-19 ENCOUNTER — Telehealth: Payer: Self-pay | Admitting: Pulmonary Disease

## 2021-09-19 NOTE — Telephone Encounter (Signed)
Called and spoke with pt about her lunesta Rx and stated that she should be able to pick Rx  up from pharmacy. Pt said that she was able to get Rx. Nothing further needed.

## 2021-10-28 ENCOUNTER — Encounter: Payer: Self-pay | Admitting: Podiatry

## 2021-11-12 ENCOUNTER — Telehealth: Payer: Self-pay | Admitting: Urology

## 2021-11-12 NOTE — Telephone Encounter (Signed)
DOS - 11/26/21  HAMMERTOE REPAIR 4TH RIGHT --- [id number] EXOSTECTOMY 5TH RIGHT --- [place] EFFECTIVE DATE  - 11/28/90   NO PRIOR AUTH REQUIRED FOR [employer] FED

## 2021-11-26 ENCOUNTER — Encounter: Payer: Self-pay | Admitting: Podiatry

## 2021-11-26 DIAGNOSIS — M25774 Osteophyte, right foot: Secondary | ICD-10-CM | POA: Diagnosis not present

## 2021-11-26 DIAGNOSIS — M2041 Other hammer toe(s) (acquired), right foot: Secondary | ICD-10-CM

## 2021-11-26 MED ORDER — HYDROCODONE-ACETAMINOPHEN 10-325 MG PO TABS: 1.0000 | ORAL_TABLET | Freq: Three times a day (TID) | ORAL | 0 refills | Status: AC | PRN

## 2021-11-26 NOTE — Addendum Note (Signed)
Addended by: Wallene Huh on: 11/26/2021 06:55 AM   Modules accepted: Orders

## 2021-12-02 ENCOUNTER — Ambulatory Visit (INDEPENDENT_AMBULATORY_CARE_PROVIDER_SITE_OTHER): Payer: Federal, State, Local not specified - PPO | Admitting: Podiatry

## 2021-12-02 ENCOUNTER — Other Ambulatory Visit: Payer: Self-pay

## 2021-12-02 ENCOUNTER — Ambulatory Visit (INDEPENDENT_AMBULATORY_CARE_PROVIDER_SITE_OTHER): Payer: Federal, State, Local not specified - PPO

## 2021-12-02 ENCOUNTER — Encounter: Payer: Self-pay | Admitting: Podiatry

## 2021-12-02 DIAGNOSIS — Z9889 Other specified postprocedural states: Secondary | ICD-10-CM

## 2021-12-02 NOTE — Progress Notes (Signed)
Subjective:   Patient ID: Robin Bowman, female   DOB: 56 y.o.   MRN: [id number]   HPI Patient is doing well with digital surgery digits 4 5 right with good alignment noted wound edges well coapted and no drainage noted    ROS      Objective:  Physical Exam  Neurovascular status intact negative Bevelyn Buckles' sign noted wound edges well coapted fourth and fifth digits right stitches intact good alignment noted     Assessment:  Doing well post digital surgery digits 4 5 right      Plan:  H&P reviewed condition and reapplied sterile dressing and continue elevation compression and reappoint 2 weeks suture removal earlier if needed and at that point we will not need to be seen again.  Patient encouraged to call with questions concerns  X-rays indicate satisfactory resection of bone good alignment noted fourth and fifth digits satisfactory removal of the bone

## 2021-12-16 ENCOUNTER — Ambulatory Visit (INDEPENDENT_AMBULATORY_CARE_PROVIDER_SITE_OTHER): Payer: Federal, State, Local not specified - PPO

## 2021-12-16 ENCOUNTER — Ambulatory Visit (INDEPENDENT_AMBULATORY_CARE_PROVIDER_SITE_OTHER): Payer: Federal, State, Local not specified - PPO | Admitting: Podiatry

## 2021-12-16 ENCOUNTER — Encounter: Payer: Self-pay | Admitting: Podiatry

## 2021-12-16 ENCOUNTER — Encounter: Payer: Federal, State, Local not specified - PPO | Admitting: Podiatry

## 2021-12-16 ENCOUNTER — Other Ambulatory Visit: Payer: Self-pay

## 2021-12-16 DIAGNOSIS — Z9889 Other specified postprocedural states: Secondary | ICD-10-CM | POA: Diagnosis not present

## 2021-12-16 NOTE — Progress Notes (Signed)
Subjective:   Patient ID: Robin Bowman, female   DOB: 56 y.o.   MRN: [id number]   HPI Patient presents stating doing very well with surgery very pleased here for stitch removal still feels some achiness around the toes   ROS      Objective:  Physical Exam  Neurovascular status intact negative Bevelyn Buckles' sign noted wound edges well coapted right fourth and fifth digits good alignment noted     Assessment:  Doing well post arthroplasty digit 4 5 right exostectomy digit 5 right     Plan:  H&P stitches removed sterile dressing applied reviewed x-rays and patient can be seen back to recheck again as needed and encouraged a gradual return to soft shoe gear over the next 2 weeks  X-rays indicate satisfactory resection of bone digits 4 5 right with good alignment

## 2021-12-16 NOTE — Progress Notes (Signed)
d 

## 2021-12-18 DIAGNOSIS — R3121 Asymptomatic microscopic hematuria: Secondary | ICD-10-CM | POA: Diagnosis not present

## 2021-12-18 DIAGNOSIS — R911 Solitary pulmonary nodule: Secondary | ICD-10-CM | POA: Diagnosis not present

## 2021-12-18 DIAGNOSIS — E669 Obesity, unspecified: Secondary | ICD-10-CM | POA: Diagnosis not present

## 2021-12-18 DIAGNOSIS — D751 Secondary polycythemia: Secondary | ICD-10-CM | POA: Diagnosis not present

## 2021-12-30 ENCOUNTER — Encounter: Payer: Self-pay | Admitting: Podiatry

## 2021-12-30 NOTE — Telephone Encounter (Signed)
Please advise 

## 2021-12-30 NOTE — Telephone Encounter (Signed)
Called patient giving recommendations per Dr Paulla Dolly. She verbalized understanding.

## 2022-02-05 ENCOUNTER — Encounter: Payer: Self-pay | Admitting: Podiatry

## 2022-02-06 ENCOUNTER — Telehealth: Payer: Self-pay

## 2022-02-06 NOTE — Telephone Encounter (Signed)
Left message with patient to recommend she call and be scheduled for evaluation with the orthotics department as she has recently seen Dr. Paulla Dolly and his note functions as a prescription. ?

## 2022-02-10 ENCOUNTER — Other Ambulatory Visit: Payer: Self-pay

## 2022-02-10 ENCOUNTER — Ambulatory Visit: Payer: Federal, State, Local not specified - PPO

## 2022-02-10 DIAGNOSIS — M2041 Other hammer toe(s) (acquired), right foot: Secondary | ICD-10-CM

## 2022-02-10 DIAGNOSIS — Z9889 Other specified postprocedural states: Secondary | ICD-10-CM

## 2022-02-10 NOTE — Progress Notes (Signed)
Patient seen for evaluation for foot orthotics. Patient requested a precertification to determine coverage. Will contact insurance provider and determine patient's out of pocket estimated cost. All questions answered and concerns addressed. ?

## 2022-02-19 ENCOUNTER — Telehealth: Payer: Self-pay

## 2022-02-19 NOTE — Telephone Encounter (Signed)
Spoke with pt and advised her that [place] with [employer] says that the pt will be responsible for 15% of the orthotics(L3020) and the pt would like to proceed with this process. Call reference number is [id number].

## 2022-02-20 ENCOUNTER — Ambulatory Visit (INDEPENDENT_AMBULATORY_CARE_PROVIDER_SITE_OTHER): Payer: Federal, State, Local not specified - PPO

## 2022-02-20 DIAGNOSIS — M2041 Other hammer toe(s) (acquired), right foot: Secondary | ICD-10-CM | POA: Diagnosis not present

## 2022-02-20 DIAGNOSIS — M722 Plantar fascial fibromatosis: Secondary | ICD-10-CM | POA: Diagnosis not present

## 2022-02-20 NOTE — Progress Notes (Signed)
SITUATION ?Reason for Consult: Evaluation for Bilateral Custom Foot Orthoses ?Patient / Caregiver Report: Patient is ready for foot orthotics ? ?OBJECTIVE DATA: ?Patient History / Diagnosis:  ?  ICD-10-CM   ?1. Hammer toe of right foot  M20.41   ?  ?2. Plantar fasciitis  M72.2   ?  ? ? ?Current or Previous Devices:   Historical user ? ?Foot Examination: ?Skin presentation:   Intact ?Ulcers & Callousing:   None ?Toe / Foot Deformities:  Hammertoes ?Weight Bearing Presentation:  Rectus ?Sensation:    Intact ? ?Shoe Size:    59M ? ?ORTHOTIC RECOMMENDATION ?Recommended Device: 1x pair of custom functional foot orthotics ? ?GOALS OF ORTHOSES ?- Reduce Pain ?- Prevent Foot Deformity ?- Prevent Progression of Further Foot Deformity ?- Relieve Pressure ?- Improve the Overall Biomechanical Function of the Foot and Lower Extremity. ? ?ACTIONS PERFORMED ?Potential out of pocket cost was communicated to patient. Patient understood and consent to casting. Patient was casted for Foot Orthoses via crush box. Procedure was explained and patient tolerated procedure well. Casts were shipped to central fabrication. All questions were answered and concerns addressed. ? ?PLAN ?Patient is to be called for fitting when devices are ready.  ? ? ?

## 2022-02-24 ENCOUNTER — Other Ambulatory Visit (HOSPITAL_COMMUNITY): Payer: Self-pay

## 2022-03-03 ENCOUNTER — Other Ambulatory Visit (HOSPITAL_COMMUNITY): Payer: Self-pay

## 2022-03-03 ENCOUNTER — Telehealth: Payer: Self-pay

## 2022-03-03 ENCOUNTER — Encounter: Payer: Self-pay | Admitting: Pulmonary Disease

## 2022-03-03 ENCOUNTER — Other Ambulatory Visit: Payer: Self-pay | Admitting: Pulmonary Disease

## 2022-03-03 ENCOUNTER — Encounter: Payer: Self-pay | Admitting: Podiatry

## 2022-03-03 MED ORDER — ESZOPICLONE 3 MG PO TABS: 3.0000 mg | ORAL_TABLET | Freq: Every day | ORAL | 5 refills | Status: DC

## 2022-03-03 NOTE — Progress Notes (Signed)
[name] renewed 

## 2022-03-03 NOTE — Telephone Encounter (Signed)
Mychart message sent by pt: ?[name] Pool (supporting Olalere, Adewale A, MD) 15 minutes ago (2:43 PM)  ? ?AG ?The prior authorization on my Eszopiclone 3 mg expedite 02/13/2022.  I would appreciate if you would go ahead and renew it, ?  ?After that I would appreciate you calling in some refills to [facility]  I am out of refills. ?  ?If you have any questions call me at [phone] ?  ?Thanks ?  ? ? ?PA was done and it has been approved. Pt just needs to have a new Rx sent to pharmacy. Dr. Jenetta Downer, please advise. ?

## 2022-03-03 NOTE — Telephone Encounter (Signed)
Patient Advocate Encounter ? ?Prior Authorization for [name] '3mg'$  tabs has been approved.   ? ?PA#  [id number] ? ?Effective dates: 02/01/22 through 03/03/23 ? ?Refill too soon. ? ?Patient Advocate ?Fax:  [phone]  ?

## 2022-03-04 NOTE — Telephone Encounter (Signed)
Please advise 

## 2022-03-17 ENCOUNTER — Ambulatory Visit: Payer: Federal, State, Local not specified - PPO

## 2022-03-17 DIAGNOSIS — M722 Plantar fascial fibromatosis: Secondary | ICD-10-CM

## 2022-03-17 DIAGNOSIS — M216X2 Other acquired deformities of left foot: Secondary | ICD-10-CM

## 2022-03-17 DIAGNOSIS — Z9889 Other specified postprocedural states: Secondary | ICD-10-CM

## 2022-03-17 DIAGNOSIS — M2041 Other hammer toe(s) (acquired), right foot: Secondary | ICD-10-CM

## 2022-03-17 NOTE — Progress Notes (Signed)
SITUATION: ?Reason for Visit: Fitting and Delivery of Custom Fabricated Foot Orthoses ?Patient Report: Patient reports comfort and is satisfied with device. ? ?OBJECTIVE DATA: ?Patient History / Diagnosis:   ?  ICD-10-CM   ?1. Hammer toe of right foot  M20.41   ?  ?2. Plantar fasciitis  M72.2   ?  ?3. Post-operative state  Z98.890   ?  ?4. Acquired equinus deformity of left foot  M21.6X2   ?  ? ? ?Provided Device:  Custom Functional Foot Orthotics ?    [employer]: [id number] ? ?GOAL OF ORTHOSIS ?- Improve gait ?- Decrease energy expenditure ?- Improve Balance ?- Provide Triplanar stability of foot complex ?- Facilitate motion ? ?ACTIONS PERFORMED ?Patient was fit with foot orthotics trimmed to shoe last. Patient tolerated fittign procedure.  ? ?Patient was provided with verbal and written instruction and demonstration regarding donning, doffing, wear, care, proper fit, function, purpose, cleaning, and use of the orthosis and in all related precautions and risks and benefits regarding the orthosis. ? ?Patient was also provided with verbal instruction regarding how to report any failures or malfunctions of the orthosis and necessary follow up care. Patient was also instructed to contact our office regarding any change in status that may affect the function of the orthosis. ? ?Patient demonstrated independence with proper donning, doffing, and fit and verbalized understanding of all instructions. ? ?PLAN: ?Patient is to follow up in one week or as necessary (PRN). All questions were answered and concerns addressed. Plan of care was discussed with and agreed upon by the patient. ? ?

## 2022-03-24 ENCOUNTER — Other Ambulatory Visit: Payer: Self-pay | Admitting: Pulmonary Disease

## 2022-03-24 NOTE — Telephone Encounter (Signed)
Last OV was 10/22. Please advise.  ?

## 2022-04-30 ENCOUNTER — Encounter: Payer: Self-pay | Admitting: Pulmonary Disease

## 2022-05-01 NOTE — Telephone Encounter (Signed)
Okay with me 

## 2022-05-12 DIAGNOSIS — Z6838 Body mass index (BMI) 38.0-38.9, adult: Secondary | ICD-10-CM | POA: Diagnosis not present

## 2022-05-12 DIAGNOSIS — Z1231 Encounter for screening mammogram for malignant neoplasm of breast: Secondary | ICD-10-CM | POA: Diagnosis not present

## 2022-05-12 DIAGNOSIS — Z01419 Encounter for gynecological examination (general) (routine) without abnormal findings: Secondary | ICD-10-CM | POA: Diagnosis not present

## 2022-06-09 DIAGNOSIS — D225 Melanocytic nevi of trunk: Secondary | ICD-10-CM | POA: Diagnosis not present

## 2022-06-09 DIAGNOSIS — D2261 Melanocytic nevi of right upper limb, including shoulder: Secondary | ICD-10-CM | POA: Diagnosis not present

## 2022-06-09 DIAGNOSIS — D2262 Melanocytic nevi of left upper limb, including shoulder: Secondary | ICD-10-CM | POA: Diagnosis not present

## 2022-06-09 DIAGNOSIS — L814 Other melanin hyperpigmentation: Secondary | ICD-10-CM | POA: Diagnosis not present

## 2022-06-09 DIAGNOSIS — D485 Neoplasm of uncertain behavior of skin: Secondary | ICD-10-CM | POA: Diagnosis not present

## 2022-06-18 DIAGNOSIS — D582 Other hemoglobinopathies: Secondary | ICD-10-CM | POA: Diagnosis not present

## 2022-06-18 DIAGNOSIS — E669 Obesity, unspecified: Secondary | ICD-10-CM | POA: Diagnosis not present

## 2022-06-18 DIAGNOSIS — D751 Secondary polycythemia: Secondary | ICD-10-CM | POA: Diagnosis not present

## 2022-07-17 ENCOUNTER — Encounter: Payer: Self-pay | Admitting: Podiatry

## 2022-07-31 ENCOUNTER — Ambulatory Visit: Payer: Federal, State, Local not specified - PPO | Admitting: *Deleted

## 2022-07-31 DIAGNOSIS — M722 Plantar fascial fibromatosis: Secondary | ICD-10-CM

## 2022-07-31 NOTE — Progress Notes (Signed)
Patient presents today to pick up custom molded foot orthotics x 2, diagnosed with plantar fasciitis by Dr. Paulla Dolly.   Orthotics were dispensed and fit was satisfactory. Reviewed instructions for break-in and wear. Written instructions given to patient.  Patient will follow up as needed.

## 2022-08-19 ENCOUNTER — Encounter: Payer: Self-pay | Admitting: Pulmonary Disease

## 2022-08-19 DIAGNOSIS — R918 Other nonspecific abnormal finding of lung field: Secondary | ICD-10-CM

## 2022-08-19 DIAGNOSIS — R911 Solitary pulmonary nodule: Secondary | ICD-10-CM

## 2022-08-19 NOTE — Telephone Encounter (Signed)
PCCs please advise when pt has been scheduled for CT scan so we can schedule OV. Thanks!

## 2022-08-20 NOTE — Telephone Encounter (Signed)
I scheduled pt's CT for 10/3 and scheduled appt with AO on 10/5.  I spoke to pt & gave her appt info.  Nothing further needed.  Will route back to triage for message to be closed.

## 2022-08-25 ENCOUNTER — Ambulatory Visit (HOSPITAL_COMMUNITY)
Admission: RE | Admit: 2022-08-25 | Discharge: 2022-08-25 | Disposition: A | Discharge status: Home / Self Care | Payer: Federal, State, Local not specified - PPO | Source: Ambulatory Visit | Attending: Pulmonary Disease | Admitting: Pulmonary Disease

## 2022-08-25 DIAGNOSIS — R918 Other nonspecific abnormal finding of lung field: Secondary | ICD-10-CM | POA: Insufficient documentation

## 2022-08-25 DIAGNOSIS — R911 Solitary pulmonary nodule: Secondary | ICD-10-CM | POA: Diagnosis not present

## 2022-08-26 ENCOUNTER — Ambulatory Visit (HOSPITAL_COMMUNITY): Payer: Federal, State, Local not specified - PPO

## 2022-08-28 ENCOUNTER — Ambulatory Visit
Admission: RE | Admit: 2022-08-28 | Discharge: 2022-08-28 | Disposition: A | Discharge status: Home / Self Care | Payer: Self-pay | Source: Ambulatory Visit | Attending: Pulmonary Disease | Admitting: Pulmonary Disease

## 2022-08-28 ENCOUNTER — Encounter: Payer: Self-pay | Admitting: Pulmonary Disease

## 2022-08-28 ENCOUNTER — Ambulatory Visit: Payer: Federal, State, Local not specified - PPO | Admitting: Pulmonary Disease

## 2022-08-28 VITALS — BP 120/70 | HR 86 | Temp 98.7°F | Ht 63.5 in | Wt 228.7 lb

## 2022-08-28 DIAGNOSIS — R911 Solitary pulmonary nodule: Secondary | ICD-10-CM

## 2022-08-28 DIAGNOSIS — R9389 Abnormal findings on diagnostic imaging of other specified body structures: Secondary | ICD-10-CM

## 2022-08-28 DIAGNOSIS — R918 Other nonspecific abnormal finding of lung field: Secondary | ICD-10-CM

## 2022-08-28 DIAGNOSIS — G4733 Obstructive sleep apnea (adult) (pediatric): Secondary | ICD-10-CM | POA: Diagnosis not present

## 2022-08-28 MED ORDER — ESZOPICLONE 3 MG PO TABS: ORAL_TABLET | ORAL | 3 refills | Status: DC

## 2022-08-28 NOTE — Addendum Note (Signed)
Addended by: Gavin Potters R on: 08/28/2022 10:02 AM   Modules accepted: Orders

## 2022-08-28 NOTE — Patient Instructions (Signed)
Schedule repeat CT in 3 months -Request for prone views on the CT  Follow-up in 3 months  Refills for [name] sent in  Call us with significant concerns  We will try and get the copies of CTs uploaded into the radiology system and request for the CTs to be sent back to [place]

## 2022-08-28 NOTE — Progress Notes (Signed)
Robin Bowman    [id number]    October 15, 1966  Primary Care Physician:Manfredi, Lesle Chris, MD  Referring Physician: Jolinda Croak, [place],   [id number]  Chief complaint:   Follow-up for lung nodule Insomnia  HPI:  Does have a history of mild obstructive sleep apnea Could not get used to using CPAP on a regular basis  Lung nodules have been followed up since 2021 Lung nodules have been generally stable with slight variability New finding of groundglass 9 mm nodule at the right base that was not present on a CT scan from October 2022-film was reviewed by myself Films going back into earlier 2022 also reviewed nonrevealing the groundglass change at the right base  Multiple nodules been followed up have remained stable  2 left lower lobe nodules 5 mm, 3 mm-at some point one measured up to 7 mm.  There was a 5 mm right lung nodule  Lung nodule was first noted in 2021 April, did stay stable on CT from October 2021 Slight increase in size to 7 mm in April 2022 and a repeat in July 2022 shows stability at 7 mm  No other significant symptoms  She has no personal history of cancer, does not smoke, no family history of cancer lung cancer  She was recently found to have erythrocytosis-hematocrit 49.5 Continues to follow-up with oncology/hematology -Erythrocytosis has been stable  Patient has significant insomnia and has been on medications for insomnia for she stated almost 20 years -Continues to use [name] 3 mg nightly -She will consider switching to a different agent sometime down the line -Still unable to sleep without a sleep aid  No new sleep symptoms  Usually tries to get in bed by 10-11, takes about 30 minutes to an hour to fall asleep About 3-4 awakenings Final up time is about 6:30 in the morning  She has chronic back pain which is worse during the nighttime  Outpatient Encounter Medications as of 08/28/2022  Medication  Sig   cholecalciferol (VITAMIN D3) 25 MCG (1000 UNIT) tablet    diclofenac (VOLTAREN) 75 MG EC tablet Take 75 mg by mouth 2 (two) times daily.   escitalopram (LEXAPRO) 20 MG tablet    Eszopiclone 3 MG TABS TAKE 1 TABLET BY MOUTH AT BEDTIME TAKE IMMEDIATELY BEFORE BEDTIME   fluticasone (FLONASE) 50 MCG/ACT nasal spray Place into both nostrils at bedtime.   Multiple Vitamin (MULTIVITAMIN) tablet Take 1 tablet by mouth daily.   pregabalin (LYRICA) 150 MG capsule Take 150 mg by mouth 2 (two) times daily.   No facility-administered encounter medications on file as of 08/28/2022.    Allergies as of 08/28/2022   (No Known Allergies)    Past Medical History:  Diagnosis Date   Arthritis    Erythrocytosis    followed by Dr Tressie Stalker ([facility] in [place])  asymptomatic   Insomnia    PMB (postmenopausal bleeding)    Seasonal allergic rhinitis    Uterine fibroid    Wears glasses     Past Surgical History:  Procedure Laterality Date   COLONOSCOPY  2015 approx.   HYSTEROSCOPY W/ ENDOMETRIAL ABLATION  05-27-2007   '@[facility]'$    w/ Novasure   HYSTEROSCOPY WITH D & C N/A 05/23/2020   Procedure: DILATATION AND CURETTAGE /HYSTEROSCOPY;  Surgeon: Jerelyn Charles, MD;  Location: [facility];  Service: Gynecology;  Laterality: N/A;    No family history on file.  Social History   Socioeconomic History   Marital status: Married    Spouse name: Not on file   Number of children: Not on file   Years of education: Not on file   Highest education level: Not on file  Occupational History   Not on file  Tobacco Use   Smoking status: Never   Smokeless tobacco: Never  Substance and Sexual Activity   Alcohol use: No   Drug use: No   Sexual activity: Not on file  Other Topics Concern   Not on file  Social History Narrative   Not on file   Social Determinants of Health   Financial Resource Strain: Not on file  Food Insecurity: Not on file  Transportation Needs: Not on file   Physical Activity: Not on file  Stress: Not on file  Social Connections: Not on file  Intimate Partner Violence: Not on file    Review of Systems  Respiratory:  Positive for apnea. Negative for shortness of breath.   Musculoskeletal:  Positive for arthralgias and back pain.  Psychiatric/Behavioral:  Positive for sleep disturbance.     Vitals:   08/28/22 0906  BP: 120/70  Pulse: 86  Temp: 98.7 F (37.1 C)  SpO2: 96%     Physical Exam Constitutional:      Appearance: She is obese.  HENT:     Head: Normocephalic and atraumatic.  Eyes:     General: No scleral icterus. Cardiovascular:     Rate and Rhythm: Normal rate and regular rhythm.     Pulses: Normal pulses.     Heart sounds: Normal heart sounds. No murmur heard.    No friction rub.  Pulmonary:     Effort: Pulmonary effort is normal. No respiratory distress.     Breath sounds: Normal breath sounds. No stridor. No wheezing or rhonchi.  Musculoskeletal:     Cervical back: No rigidity or tenderness.  Skin:    Coloration: Skin is not jaundiced or pale.  Neurological:     Mental Status: She is alert.  Psychiatric:        Mood and Affect: Mood normal.    Data Reviewed: Records from [facility] reviewed on care everywhere  Most recent CT scan of the chest 08/26/2021 reviewed with the patient  She did give me a CT scan from October 2022 that I was able to review and compare with current CT, there is a groundglass change at the right base measuring about 9 mm that is new compared to that CT  Actual CT was reviewed  Assessment:  Mild obstructive sleep apnea -Unable to tolerate CPAP  Lung nodules -Lung nodules are stable New finding of a groundglass change at the right base measuring about 9 mm -Plan for the new lung nodule will be a repeat CT in 3 months to assess for stability -We will get prone views as well to make sure its not related to atelectasis  Severe chronic anemia -Continues to tolerate [name] well  and this helps quality of sleep  Plan/Recommendations:  We will follow-up in 3 months  Repeat CT scan of the chest in 3 months  Continue [name] -Prescription sent in  The possibilities include an inflammatory, infectious, bronchoalveolar cancer can also present as groundglass nodule It is prudent to follow-up with a CT scan  Surgical options discussed but I believe this will be extremely aggressive especially with patient's low risk profile  She did provide [place] with previous CTs that was done in the [facility] system  to try and upload to [facility] system as she will be getting her care here going forwards  Sherrilyn Rist, MD [facility] Pager: See Shea Evans

## 2022-09-04 ENCOUNTER — Encounter: Payer: Self-pay | Admitting: Pulmonary Disease

## 2022-09-04 NOTE — Telephone Encounter (Signed)
Dr. Ander Slade, please advise on pt's message regarding her lung nodule. Thanks.

## 2022-09-11 ENCOUNTER — Encounter: Payer: Self-pay | Admitting: Pulmonary Disease

## 2022-09-12 NOTE — Telephone Encounter (Signed)
Dr. Ander Slade, please advise if ok to change billing code from Diagnostic to preventative for pt's insurance. I will also forward to Kathlee Nations with our billing department to assure this is able to be done. Thanks.

## 2022-09-12 NOTE — Telephone Encounter (Signed)
Okay to change billing code

## 2022-09-12 NOTE — Telephone Encounter (Signed)
Forwarding to [place], thanks.

## 2022-09-15 NOTE — Telephone Encounter (Signed)
Okay. Please let Robin Bowman know

## 2022-10-02 DIAGNOSIS — M47816 Spondylosis without myelopathy or radiculopathy, lumbar region: Secondary | ICD-10-CM | POA: Diagnosis not present

## 2022-10-10 ENCOUNTER — Encounter: Payer: Self-pay | Admitting: Pulmonary Disease

## 2022-10-10 DIAGNOSIS — R911 Solitary pulmonary nodule: Secondary | ICD-10-CM

## 2022-10-10 NOTE — Telephone Encounter (Signed)
FYI

## 2022-11-18 ENCOUNTER — Ambulatory Visit (HOSPITAL_COMMUNITY)
Admission: RE | Admit: 2022-11-18 | Discharge: 2022-11-18 | Disposition: A | Discharge status: Home / Self Care | Payer: Federal, State, Local not specified - PPO | Source: Ambulatory Visit | Attending: Pulmonary Disease | Admitting: Pulmonary Disease

## 2022-11-18 DIAGNOSIS — R911 Solitary pulmonary nodule: Secondary | ICD-10-CM | POA: Diagnosis not present

## 2022-11-18 DIAGNOSIS — R918 Other nonspecific abnormal finding of lung field: Secondary | ICD-10-CM | POA: Diagnosis not present

## 2022-11-20 ENCOUNTER — Encounter: Payer: Self-pay | Admitting: Pulmonary Disease

## 2022-11-20 ENCOUNTER — Ambulatory Visit: Payer: Federal, State, Local not specified - PPO | Admitting: Pulmonary Disease

## 2022-11-20 VITALS — BP 118/74 | HR 83 | Ht 63.5 in | Wt 228.0 lb

## 2022-11-20 DIAGNOSIS — R9389 Abnormal findings on diagnostic imaging of other specified body structures: Secondary | ICD-10-CM | POA: Diagnosis not present

## 2022-11-20 NOTE — Patient Instructions (Signed)
Repeat CT scan of the chest without contrast in 9 months  I will see you a few days to a week after your next CAT scan  Call with significant concerns

## 2022-11-20 NOTE — Progress Notes (Signed)
Robin Bowman    [id number]    1966/09/04  Primary Care Physician:Miller, Lattie Haw, MD  Referring Physician: Jolinda Bowman, [place],  Robin Bowman [id number]  Chief complaint:   Follow-up for lung nodule Insomnia  HPI:  Does have a history of mild obstructive sleep apnea Could not get used to using CPAP on a regular basis  Lung nodules have been followed up since 2021 Most recent CT shows stability and lung nodules Groundglass 9 mm nodule at the right base that was not present on a CAT scan from October 2022 was reviewed, it is better on recent CAT scan To left lower lobe nodules stable, although nodules stable Groundglass changes description on overall CT-nonspecific  No other significant symptoms  She has no personal history of cancer, does not smoke, no family history of cancer lung cancer  She was recently found to have erythrocytosis-hematocrit 49.5 Continues to follow-up with oncology/hematology -Erythrocytosis has been stable  Patient has significant insomnia and has been on medications for insomnia for she stated almost 20 years -Continues to use [name] 3 mg nightly -She will consider switching to a different agent sometime down the line -Still unable to sleep without a sleep aid  No new sleep symptoms  Usually tries to get in bed by 10-11, takes about 30 minutes to an hour to fall asleep About 3-4 awakenings Final up time is about 6:30 in the morning  She has chronic back pain which is worse during the nighttime  Outpatient Encounter Medications as of 11/20/2022  Medication Sig   cholecalciferol (VITAMIN D3) 25 MCG (1000 UNIT) tablet    diclofenac (VOLTAREN) 75 MG EC tablet Take 75 mg by mouth 2 (two) times daily.   escitalopram (LEXAPRO) 20 MG tablet    Eszopiclone 3 MG TABS TAKE 1 TABLET BY MOUTH AT BEDTIME   fluticasone (FLONASE) 50 MCG/ACT nasal spray Place into both nostrils at bedtime.   Multiple Vitamin  (MULTIVITAMIN) tablet Take 1 tablet by mouth daily.   pregabalin (LYRICA) 150 MG capsule Take 150 mg by mouth 2 (two) times daily.   No facility-administered encounter medications on file as of 11/20/2022.    Allergies as of 11/20/2022   (No Known Allergies)    Past Medical History:  Diagnosis Date   Arthritis    Erythrocytosis    followed by Dr Tressie Stalker ([facility] in [place])  asymptomatic   Insomnia    PMB (postmenopausal bleeding)    Seasonal allergic rhinitis    Uterine fibroid    Wears glasses     Past Surgical History:  Procedure Laterality Date   COLONOSCOPY  2015 approx.   HYSTEROSCOPY W/ ENDOMETRIAL ABLATION  05-27-2007   '@[facility]    w/ Novasure   HYSTEROSCOPY WITH D & C N/A 05/23/2020   Procedure: DILATATION AND CURETTAGE /HYSTEROSCOPY;  Surgeon: Jerelyn Charles, MD;  Location: [facility];  Service: Gynecology;  Laterality: N/A;    No family history on file.  Social History   Socioeconomic History   Marital status: Married    Spouse name: Not on file   Number of children: Not on file   Years of education: Not on file   Highest education level: Not on file  Occupational History   Not on file  Tobacco Use   Smoking status: Never   Smokeless tobacco: Never  Substance and Sexual Activity   Alcohol use: No   Drug use:  No   Sexual activity: Not on file  Other Topics Concern   Not on file  Social History Narrative   Not on file   Social Determinants of Health   Financial Resource Strain: Not on file  Food Insecurity: Not on file  Transportation Needs: Not on file  Physical Activity: Not on file  Stress: Not on file  Social Connections: Not on file  Intimate Partner Violence: Not on file    Review of Systems  Respiratory:  Positive for apnea. Negative for shortness of breath.   Musculoskeletal:  Positive for arthralgias and back pain.  Psychiatric/Behavioral:  Positive for sleep disturbance.     Vitals:   11/20/22 1130  BP:  118/74  Pulse: 83  SpO2: 95%     Physical Exam Constitutional:      Appearance: She is obese.  HENT:     Head: Normocephalic and atraumatic.  Eyes:     General: No scleral icterus. Cardiovascular:     Rate and Rhythm: Normal rate and regular rhythm.     Pulses: Normal pulses.     Heart sounds: Normal heart sounds. No murmur heard.    No friction rub.  Pulmonary:     Effort: Pulmonary effort is normal. No respiratory distress.     Breath sounds: Normal breath sounds. No stridor. No wheezing or rhonchi.  Musculoskeletal:     Cervical back: No rigidity or tenderness.  Skin:    Coloration: Skin is not jaundiced or pale.  Neurological:     Mental Status: She is alert.  Psychiatric:        Mood and Affect: Mood normal.    Data Reviewed: Records from [facility] reviewed on care everywhere  CT scan from December 27 reviewed with the patient and compared with the one from October Stable findings with resolution of the groundglass change at the base on the right side  Overall CT scans are stable  Assessment:  Mild obstructive sleep apnea -Unable to tolerate CPAP -Monitor symptoms  Lung nodules -Lung nodules are stable -New groundglass changes is resolving-reflecting either infectious or inflammatory process  Severe chronic anemia -Continues to tolerate [name] well and this helps quality of sleep  Plan/Recommendations:  We will follow-up in 9 months  Repeat CT scan of the chest in 9 months  With stable CT scan findings No other changes need made at the present time  Previous CTs that she had done in the [facility] system have been uploaded into our radiology system   Sherrilyn Rist, MD [place] [facility] Pager: See Shea Evans

## 2022-11-28 ENCOUNTER — Other Ambulatory Visit (HOSPITAL_COMMUNITY): Payer: Federal, State, Local not specified - PPO

## 2022-12-03 DIAGNOSIS — D751 Secondary polycythemia: Secondary | ICD-10-CM | POA: Diagnosis not present

## 2022-12-03 DIAGNOSIS — Z13220 Encounter for screening for lipoid disorders: Secondary | ICD-10-CM | POA: Diagnosis not present

## 2022-12-03 DIAGNOSIS — Z0000 Encounter for general adult medical examination without abnormal findings: Secondary | ICD-10-CM | POA: Diagnosis not present

## 2022-12-03 DIAGNOSIS — E6609 Other obesity due to excess calories: Secondary | ICD-10-CM | POA: Diagnosis not present

## 2022-12-03 DIAGNOSIS — Z6839 Body mass index (BMI) 39.0-39.9, adult: Secondary | ICD-10-CM | POA: Diagnosis not present

## 2022-12-09 DIAGNOSIS — R197 Diarrhea, unspecified: Secondary | ICD-10-CM | POA: Diagnosis not present

## 2022-12-09 DIAGNOSIS — R112 Nausea with vomiting, unspecified: Secondary | ICD-10-CM | POA: Diagnosis not present

## 2022-12-12 DIAGNOSIS — A084 Viral intestinal infection, unspecified: Secondary | ICD-10-CM | POA: Diagnosis not present

## 2022-12-19 DIAGNOSIS — N390 Urinary tract infection, site not specified: Secondary | ICD-10-CM | POA: Diagnosis not present

## 2022-12-19 DIAGNOSIS — R911 Solitary pulmonary nodule: Secondary | ICD-10-CM | POA: Diagnosis not present

## 2022-12-19 DIAGNOSIS — G4709 Other insomnia: Secondary | ICD-10-CM | POA: Diagnosis not present

## 2022-12-19 DIAGNOSIS — D751 Secondary polycythemia: Secondary | ICD-10-CM | POA: Diagnosis not present

## 2022-12-19 DIAGNOSIS — E669 Obesity, unspecified: Secondary | ICD-10-CM | POA: Diagnosis not present

## 2023-01-05 DIAGNOSIS — D751 Secondary polycythemia: Secondary | ICD-10-CM | POA: Diagnosis not present

## 2023-01-05 DIAGNOSIS — E669 Obesity, unspecified: Secondary | ICD-10-CM | POA: Diagnosis not present

## 2023-01-06 DIAGNOSIS — R3121 Asymptomatic microscopic hematuria: Secondary | ICD-10-CM | POA: Diagnosis not present

## 2023-01-06 DIAGNOSIS — N390 Urinary tract infection, site not specified: Secondary | ICD-10-CM | POA: Diagnosis not present

## 2023-01-15 DIAGNOSIS — N951 Menopausal and female climacteric states: Secondary | ICD-10-CM | POA: Diagnosis not present

## 2023-01-16 ENCOUNTER — Other Ambulatory Visit: Payer: Self-pay | Admitting: Pulmonary Disease

## 2023-01-16 ENCOUNTER — Encounter: Payer: Self-pay | Admitting: Pulmonary Disease

## 2023-01-19 ENCOUNTER — Other Ambulatory Visit: Payer: Self-pay | Admitting: Pulmonary Disease

## 2023-01-19 MED ORDER — ESZOPICLONE 3 MG PO TABS: ORAL_TABLET | ORAL | 3 refills | Status: DC

## 2023-01-19 NOTE — Telephone Encounter (Signed)
Refills sent in

## 2023-01-19 NOTE — Telephone Encounter (Signed)
Refill sent in

## 2023-02-20 ENCOUNTER — Encounter: Payer: Self-pay | Admitting: Pulmonary Disease

## 2023-02-20 NOTE — Telephone Encounter (Signed)
Eszopiclone 3 mg  is requiring a PA according to pt. Can you please assist with this?

## 2023-02-24 ENCOUNTER — Other Ambulatory Visit (HOSPITAL_COMMUNITY): Payer: Self-pay

## 2023-02-26 ENCOUNTER — Telehealth: Payer: Self-pay

## 2023-02-26 NOTE — Telephone Encounter (Signed)
PA has been APPROVED through 02/26/2024

## 2023-02-26 NOTE — Telephone Encounter (Signed)
PA has been submitted and is pending determination, will be updated in additional encounter created. 

## 2023-02-26 NOTE — Telephone Encounter (Signed)
PA renewal request received via provider for Eszopiclone 3MG  tablets  PA has been submitted and is pending determination  Key: [id number]

## 2023-03-02 NOTE — Telephone Encounter (Signed)
Manley Mason, CPhT      02/26/23 11:59 AM Note PA has been APPROVED through 02/26/2024    Response from telephone encounter on 02/26/23. Will call pt's pharmacy later today to confirm approval.

## 2023-05-11 ENCOUNTER — Encounter: Payer: Self-pay | Admitting: Pulmonary Disease

## 2023-05-13 NOTE — Telephone Encounter (Signed)
Received a message from patient requesting a refill on her ezopiclone 3mg  to [facility] to [facility] [address]  AO, can you please advise? Thanks!

## 2023-05-14 ENCOUNTER — Other Ambulatory Visit: Payer: Self-pay | Admitting: Pulmonary Disease

## 2023-05-14 MED ORDER — ESZOPICLONE 3 MG PO TABS: ORAL_TABLET | ORAL | 3 refills | Status: DC

## 2023-05-25 DIAGNOSIS — J029 Acute pharyngitis, unspecified: Secondary | ICD-10-CM | POA: Diagnosis not present

## 2023-06-03 DIAGNOSIS — D2262 Melanocytic nevi of left upper limb, including shoulder: Secondary | ICD-10-CM | POA: Diagnosis not present

## 2023-06-03 DIAGNOSIS — D225 Melanocytic nevi of trunk: Secondary | ICD-10-CM | POA: Diagnosis not present

## 2023-06-03 DIAGNOSIS — D2261 Melanocytic nevi of right upper limb, including shoulder: Secondary | ICD-10-CM | POA: Diagnosis not present

## 2023-06-03 DIAGNOSIS — D224 Melanocytic nevi of scalp and neck: Secondary | ICD-10-CM | POA: Diagnosis not present

## 2023-06-24 ENCOUNTER — Telehealth: Payer: Self-pay | Admitting: Pulmonary Disease

## 2023-06-24 DIAGNOSIS — R911 Solitary pulmonary nodule: Secondary | ICD-10-CM

## 2023-06-24 DIAGNOSIS — R9389 Abnormal findings on diagnostic imaging of other specified body structures: Secondary | ICD-10-CM

## 2023-06-24 NOTE — Telephone Encounter (Signed)
I called PT to sched a recall/Follow up and on last AVS Dr. Val Eagle wanted to have a CT done  before this appt but no order is in.   AVS states:  Repeat CT scan of the chest without contrast in 9 months   Can we put in an order? Thanks.

## 2023-06-30 NOTE — Telephone Encounter (Signed)
Yes, kindly place order for CT

## 2023-06-30 NOTE — Telephone Encounter (Signed)
CT order placed

## 2023-07-03 ENCOUNTER — Other Ambulatory Visit (HOSPITAL_COMMUNITY): Payer: Federal, State, Local not specified - PPO

## 2023-07-09 DIAGNOSIS — Z124 Encounter for screening for malignant neoplasm of cervix: Secondary | ICD-10-CM | POA: Diagnosis not present

## 2023-07-09 DIAGNOSIS — Z6838 Body mass index (BMI) 38.0-38.9, adult: Secondary | ICD-10-CM | POA: Diagnosis not present

## 2023-07-09 DIAGNOSIS — Z01419 Encounter for gynecological examination (general) (routine) without abnormal findings: Secondary | ICD-10-CM | POA: Diagnosis not present

## 2023-07-09 DIAGNOSIS — Z1231 Encounter for screening mammogram for malignant neoplasm of breast: Secondary | ICD-10-CM | POA: Diagnosis not present

## 2023-07-10 DIAGNOSIS — E669 Obesity, unspecified: Secondary | ICD-10-CM | POA: Diagnosis not present

## 2023-07-10 DIAGNOSIS — D751 Secondary polycythemia: Secondary | ICD-10-CM | POA: Diagnosis not present

## 2023-07-21 DIAGNOSIS — R911 Solitary pulmonary nodule: Secondary | ICD-10-CM | POA: Diagnosis not present

## 2023-07-21 DIAGNOSIS — M47816 Spondylosis without myelopathy or radiculopathy, lumbar region: Secondary | ICD-10-CM | POA: Diagnosis not present

## 2023-07-21 DIAGNOSIS — D751 Secondary polycythemia: Secondary | ICD-10-CM | POA: Diagnosis not present

## 2023-07-21 DIAGNOSIS — N951 Menopausal and female climacteric states: Secondary | ICD-10-CM | POA: Diagnosis not present

## 2023-07-21 DIAGNOSIS — M545 Low back pain, unspecified: Secondary | ICD-10-CM | POA: Diagnosis not present

## 2023-08-12 ENCOUNTER — Ambulatory Visit (HOSPITAL_COMMUNITY)
Admission: RE | Admit: 2023-08-12 | Discharge: 2023-08-12 | Disposition: A | Discharge status: Home / Self Care | Payer: Federal, State, Local not specified - PPO | Source: Ambulatory Visit | Attending: Pulmonary Disease | Admitting: Pulmonary Disease

## 2023-08-12 DIAGNOSIS — R918 Other nonspecific abnormal finding of lung field: Secondary | ICD-10-CM | POA: Diagnosis not present

## 2023-08-12 DIAGNOSIS — R9389 Abnormal findings on diagnostic imaging of other specified body structures: Secondary | ICD-10-CM | POA: Diagnosis not present

## 2023-08-12 DIAGNOSIS — R911 Solitary pulmonary nodule: Secondary | ICD-10-CM | POA: Insufficient documentation

## 2023-08-12 DIAGNOSIS — I700 Atherosclerosis of aorta: Secondary | ICD-10-CM | POA: Diagnosis not present

## 2023-08-19 ENCOUNTER — Encounter: Payer: Self-pay | Admitting: Pulmonary Disease

## 2023-08-19 NOTE — Telephone Encounter (Signed)
Results are still not back. Ok for her to still see you on Friday?

## 2023-08-20 ENCOUNTER — Ambulatory Visit: Payer: Federal, State, Local not specified - PPO | Admitting: Podiatry

## 2023-08-20 ENCOUNTER — Encounter: Payer: Self-pay | Admitting: Podiatry

## 2023-08-20 VITALS — BP 133/65 | HR 79

## 2023-08-20 DIAGNOSIS — M722 Plantar fascial fibromatosis: Secondary | ICD-10-CM

## 2023-08-20 NOTE — Telephone Encounter (Signed)
Patent checking on message for CT scan results. Patient phone number is [phone].

## 2023-08-20 NOTE — Telephone Encounter (Signed)
PT calling again to ask if she should still come in tomorrow for her appt . I reached out to Dr. Val Eagle and he said to resch her. She was then upset his appts are sched out to mid Oct and wants to be worked in with another appt or televisit.   She is highly upset her CT results are not in and in the past they have been in in 24 hours.She is not acceptoing my answers about shortage of Dr's to read CT's.  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  She wants:  Please call to see if we can work in.   Please call  the radiology to see why the CT results are taking so long.   [phone]

## 2023-08-20 NOTE — Progress Notes (Signed)
Subjective:   Patient ID: Robin Bowman, female   DOB: 57 y.o.   MRN: [id number]   HPI Patient presents stating that she needs new orthotics and she is starting to get some discomfort plantar aspect right heel of a moderate nature   ROS      Objective:  Physical Exam  Neurovascular status intact discomfort plantar heel right at the insertional point tendon calcaneus inflammation fluid history of orthotics which have done well for her to prevent serious problems     Assessment:  Moderate plantar fasciitis right with inflammation     Plan:  Sterile prep went ahead today discussed and pedorthist casted for functional orthotic devices to lift the arches up properly.  Reappoint when returned

## 2023-08-20 NOTE — Telephone Encounter (Signed)
PT calling again about CT results. I have twice offered to resched her with Dr. Or AP since no results are in. She has declined. I let her know that there is a shortage of Dr's that read the results and it is taking longer for not just her but several other Pts.  Dr. Should she still come in tomorrow for her FU?

## 2023-08-21 ENCOUNTER — Telehealth (INDEPENDENT_AMBULATORY_CARE_PROVIDER_SITE_OTHER): Payer: Federal, State, Local not specified - PPO | Admitting: Pulmonary Disease

## 2023-08-21 DIAGNOSIS — R9389 Abnormal findings on diagnostic imaging of other specified body structures: Secondary | ICD-10-CM

## 2023-08-21 DIAGNOSIS — R918 Other nonspecific abnormal finding of lung field: Secondary | ICD-10-CM | POA: Diagnosis not present

## 2023-08-21 DIAGNOSIS — R911 Solitary pulmonary nodule: Secondary | ICD-10-CM

## 2023-08-21 MED ORDER — ESZOPICLONE 3 MG PO TABS: ORAL_TABLET | ORAL | 5 refills | Status: DC

## 2023-08-21 NOTE — Patient Instructions (Signed)
Repeat CT scan of the chest in about 18 months  The nodules have remained stable  Refills for [name] sent into pharmacy  Follow-up a year from now

## 2023-08-21 NOTE — Progress Notes (Signed)
Virtual Visit via Video Note  I connected with Farrel Demark on 08/21/23 at 10:15 AM EDT by a video enabled telemedicine application and verified that I am speaking with the correct person using two identifiers.  Location: Patient: At home Provider: [address]   I discussed the limitations of evaluation and management by telemedicine and the availability of in person appointments. The patient expressed understanding and agreed to proceed.  History of Present Illness: History of lung nodules that we have been following radiologically  Most recent CT scan was discussed during the visit today showing stable nodules.  Current CT is 9 months following the previous 1 Previous CT had shown a new groundglass infiltrate which we followed up to resolution  All other nodules have remained stable  Insomnia well-controlled with  Observations/Objective: She looks well  No significant concerns about from discussion regarding the lung nodules    Assessment and Plan: Lung nodules  -Stable from last CT -I believe it is appropriate to follow-up again in about 18 months to ensure stability of the nodules  Refills for [name] sent into pharmacy    Follow Up Instructions:    I discussed the assessment and treatment plan with the patient. The patient was provided an opportunity to ask questions and all were answered. The patient agreed with the plan and demonstrated an understanding of the instructions.   The patient was advised to call back or seek an in-person evaluation if the symptoms worsen or if the condition fails to improve as anticipated.  I provided 23 minutes of non-face-to-face time during this encounter.   Tomma Lightning, MD

## 2023-08-25 NOTE — Progress Notes (Signed)
Patient impression were taken then scanned items orthotics on order when in patient will return for fitting Orthotic eval     Patient will benefit from CFO's as they will help provide total contact to MLA's helping to better distribute body weight across BIL feet greater reducing plantar pressure and pain and to also encourage FF and RF alignment.  Addison Bailey Pped, Cfo, Cfm

## 2023-09-28 ENCOUNTER — Ambulatory Visit: Payer: Federal, State, Local not specified - PPO

## 2023-09-28 NOTE — Progress Notes (Signed)
Patient presents today to pick up custom molded foot orthotics, diagnosed with Plantar fasciitis by Dr. Charlsie Merles.   Orthotics were dispensed and fit was satisfactory. Patient will call and order 2nd pair once she knows if inserts will work well. Reviewed instructions for break-in and wear. Written instructions given to patient.  Patient will follow up as needed.   Robin Bowman Cped,CFo, CFm

## 2023-09-30 DIAGNOSIS — R7303 Prediabetes: Secondary | ICD-10-CM | POA: Diagnosis not present

## 2023-09-30 DIAGNOSIS — N390 Urinary tract infection, site not specified: Secondary | ICD-10-CM | POA: Diagnosis not present

## 2023-09-30 DIAGNOSIS — E782 Mixed hyperlipidemia: Secondary | ICD-10-CM | POA: Diagnosis not present

## 2023-09-30 DIAGNOSIS — R3915 Urgency of urination: Secondary | ICD-10-CM | POA: Diagnosis not present

## 2023-10-27 DIAGNOSIS — N951 Menopausal and female climacteric states: Secondary | ICD-10-CM | POA: Diagnosis not present

## 2023-11-11 ENCOUNTER — Encounter: Payer: Self-pay | Admitting: Podiatry

## 2023-11-30 ENCOUNTER — Telehealth: Payer: Self-pay

## 2023-11-30 NOTE — Telephone Encounter (Signed)
 CALLED PT TO INFORM HER, HER 2ND PAIR OF ORTHOTICS ARE IN

## 2023-12-03 ENCOUNTER — Telehealth: Payer: Self-pay

## 2023-12-03 NOTE — Telephone Encounter (Signed)
 Taking 2nd paiir of orthos to [facility] on 1/10.

## 2023-12-07 DIAGNOSIS — E782 Mixed hyperlipidemia: Secondary | ICD-10-CM | POA: Diagnosis not present

## 2023-12-07 DIAGNOSIS — R7303 Prediabetes: Secondary | ICD-10-CM | POA: Diagnosis not present

## 2023-12-07 NOTE — Telephone Encounter (Signed)
 Orthotics taken to [facility] By mistake Joni Reining will bring them on the 15th here to [place] I called and LM for patient asking her if she wants to PU here or I can ship  Addison Bailey CPed, CFo, CFm

## 2023-12-08 NOTE — Telephone Encounter (Signed)
 Orthotics shipped today tracking number [id number] USPS

## 2023-12-09 DIAGNOSIS — Z23 Encounter for immunization: Secondary | ICD-10-CM | POA: Diagnosis not present

## 2023-12-09 DIAGNOSIS — Z0000 Encounter for general adult medical examination without abnormal findings: Secondary | ICD-10-CM | POA: Diagnosis not present

## 2023-12-19 ENCOUNTER — Encounter: Payer: Self-pay | Admitting: Pulmonary Disease

## 2023-12-30 DIAGNOSIS — Z23 Encounter for immunization: Secondary | ICD-10-CM | POA: Diagnosis not present

## 2024-01-11 DIAGNOSIS — J069 Acute upper respiratory infection, unspecified: Secondary | ICD-10-CM | POA: Diagnosis not present

## 2024-02-03 DIAGNOSIS — E669 Obesity, unspecified: Secondary | ICD-10-CM | POA: Diagnosis not present

## 2024-02-03 DIAGNOSIS — D751 Secondary polycythemia: Secondary | ICD-10-CM | POA: Diagnosis not present

## 2024-02-03 DIAGNOSIS — D72820 Lymphocytosis (symptomatic): Secondary | ICD-10-CM | POA: Diagnosis not present

## 2024-02-03 DIAGNOSIS — D7219 Other eosinophilia: Secondary | ICD-10-CM | POA: Diagnosis not present

## 2024-02-03 DIAGNOSIS — R911 Solitary pulmonary nodule: Secondary | ICD-10-CM | POA: Diagnosis not present

## 2024-03-02 ENCOUNTER — Encounter: Payer: Self-pay | Admitting: Pulmonary Disease

## 2024-03-04 ENCOUNTER — Other Ambulatory Visit: Payer: Self-pay | Admitting: Pulmonary Disease

## 2024-03-04 ENCOUNTER — Other Ambulatory Visit (HOSPITAL_COMMUNITY): Payer: Self-pay

## 2024-03-04 NOTE — Telephone Encounter (Signed)
**Note De-identified  [place] Obfuscation** Please advise 

## 2024-03-07 ENCOUNTER — Other Ambulatory Visit: Payer: Self-pay | Admitting: Pulmonary Disease

## 2024-03-07 MED ORDER — ESZOPICLONE 3 MG PO TABS: ORAL_TABLET | ORAL | 5 refills | Status: DC

## 2024-03-07 NOTE — Telephone Encounter (Signed)
 Copied from CRM [id number]. Topic: Appointments - Scheduling Inquiry for Clinic >> Mar 07, 2024  9:06 AM Crist Dominion wrote: Reason for CRM: Patient was advised that cannot get her prescription refill until she is seen in office by Dr. Gaynell Keeler. Patient is scheduled for 6/10 but is requesting to be seen sooner (if possible) as she needs her refill.  Patient requesting refill for lunesta. Patient did have a video visit in 08/21/2023.  Dr.Olalere can you please advise .

## 2024-03-08 ENCOUNTER — Telehealth: Payer: Self-pay

## 2024-03-08 ENCOUNTER — Other Ambulatory Visit (HOSPITAL_COMMUNITY): Payer: Self-pay

## 2024-03-08 NOTE — Telephone Encounter (Signed)
 Unable to do prior auth due to patient not having been seen since 2023 and no valid appointment. Shows patient cancelled appointment originally made for June 2025

## 2024-03-08 NOTE — Telephone Encounter (Signed)
 Pharmacy Patient Advocate Encounter  Received notification from [facility] that Prior Authorization for Eszopiclone 3MG  tablets  has been APPROVED from 03/08/2024 to 03/08/2025 - $7.50 per test claim   PA #/Case ID/Reference #: [id number]

## 2024-03-08 NOTE — Telephone Encounter (Addendum)
 Courtesy PA submitted and approved  PA request has been Approved. New Encounter has been or will be created for follow up. For additional info see Pharmacy Prior Auth telephone encounter from 04/15.   For additional approvals updated chart notes documenting clinical benefit will be needed for future renewals. Previous video visit only discussed CT results

## 2024-04-27 DIAGNOSIS — Z23 Encounter for immunization: Secondary | ICD-10-CM | POA: Diagnosis not present

## 2024-05-03 ENCOUNTER — Ambulatory Visit: Admitting: Pulmonary Disease

## 2024-06-22 DIAGNOSIS — D2261 Melanocytic nevi of right upper limb, including shoulder: Secondary | ICD-10-CM | POA: Diagnosis not present

## 2024-06-22 DIAGNOSIS — L821 Other seborrheic keratosis: Secondary | ICD-10-CM | POA: Diagnosis not present

## 2024-06-22 DIAGNOSIS — D2262 Melanocytic nevi of left upper limb, including shoulder: Secondary | ICD-10-CM | POA: Diagnosis not present

## 2024-06-22 DIAGNOSIS — D225 Melanocytic nevi of trunk: Secondary | ICD-10-CM | POA: Diagnosis not present

## 2024-06-28 DIAGNOSIS — F4320 Adjustment disorder, unspecified: Secondary | ICD-10-CM | POA: Diagnosis not present

## 2024-08-02 DIAGNOSIS — Z01419 Encounter for gynecological examination (general) (routine) without abnormal findings: Secondary | ICD-10-CM | POA: Diagnosis not present

## 2024-08-02 DIAGNOSIS — Z1231 Encounter for screening mammogram for malignant neoplasm of breast: Secondary | ICD-10-CM | POA: Diagnosis not present

## 2024-08-03 ENCOUNTER — Ambulatory Visit: Admitting: Pulmonary Disease

## 2024-08-03 VITALS — BP 113/67 | HR 75 | Ht 63.5 in | Wt 224.0 lb

## 2024-08-03 DIAGNOSIS — R918 Other nonspecific abnormal finding of lung field: Secondary | ICD-10-CM | POA: Diagnosis not present

## 2024-08-03 MED ORDER — ESZOPICLONE 3 MG PO TABS: ORAL_TABLET | ORAL | 5 refills | Status: AC

## 2024-08-03 NOTE — Progress Notes (Signed)
 Robin Bowman    [id number]    07-09-1966  Primary Care Physician:Miller, Olam, MD  Referring Physician: Cleotilde Olam, MD [address] [place],  [place] [address]  Chief complaint:   Follow-up for lung nodule Insomnia  HPI:  Does have a history of mild obstructive sleep apnea Could not get used to using CPAP on a regular basis  History of lung nodules, followed since 2021 Most recent CT 08/12/2023 was stable, inflammatory changes did resolve The ground glass nodule at the right base that was not present on previous CT also did improve in size Overall stable nodules  No other ongoing symptoms  Still requires [name]  nightly  No personal history of cancer, non-smoker, no family history of lung cancer  Initial evaluation started cough with erythrocytosis with hematocrit of 49.5, this has improved to 43 Continues to follow-up with hematology/oncology  Patient has significant insomnia and has been on medications for insomnia for she stated almost 20 years -Continues to use [name]  3 mg nightly - Stable  No new sleep symptoms  Sleep habits are unchanged  She has chronic back pain which is worse during the nighttime  Outpatient Encounter Medications as of 08/03/2024  Medication Sig   cholecalciferol (VITAMIN D3) 25 MCG (1000 UNIT) tablet    diclofenac (VOLTAREN) 75 MG EC tablet Take 75 mg by mouth 2 (two) times daily.   estradiol (ESTRACE) 0.1 MG/GM vaginal cream APPLY 0.5-1 GRAM VAGINAL NIGHLTY X 2 WEEKS THEN 2-3 X PER WEEK   Eszopiclone  3 MG TABS TAKE 1 TABLET BY MOUTH AT BEDTIME   fluticasone (FLONASE) 50 MCG/ACT nasal spray Place into both nostrils at bedtime.   Multiple Vitamin (MULTIVITAMIN) tablet Take 1 tablet by mouth daily.   pregabalin (LYRICA) 150 MG capsule Take 150 mg by mouth daily.   VEOZAH 45 MG TABS Take 1 tablet by mouth daily.   No facility-administered encounter medications on file as of 08/03/2024.    Allergies as of 08/03/2024   (No  Known Allergies)    Past Medical History:  Diagnosis Date   Arthritis    Erythrocytosis    followed by Dr Pernell ([facility] in [place])  asymptomatic   Insomnia    PMB (postmenopausal bleeding)    Seasonal allergic rhinitis    Uterine fibroid    Wears glasses     Past Surgical History:  Procedure Laterality Date   COLONOSCOPY  2015 approx.   HYSTEROSCOPY W/ ENDOMETRIAL ABLATION  05-27-2007   @[facility]    w/ Novasure   HYSTEROSCOPY WITH D & C N/A 05/23/2020   Procedure: DILATATION AND CURETTAGE /HYSTEROSCOPY;  Surgeon: Gretta Gums, MD;  Location: [facility];  Service: Gynecology;  Laterality: N/A;    No family history on file.  Social History   Socioeconomic History   Marital status: Married    Spouse name: Not on file   Number of children: Not on file   Years of education: Not on file   Highest education level: Not on file  Occupational History   Not on file  Tobacco Use   Smoking status: Never   Smokeless tobacco: Never  Substance and Sexual Activity   Alcohol use: No   Drug use: No   Sexual activity: Not on file  Other Topics Concern   Not on file  Social History Narrative   Not on file   Social Drivers of Health   Financial Resource Strain: Low Risk  (02/03/2024)  Received from [employer]   Overall Financial Resource Strain (CARDIA)    Difficulty of Paying Living Expenses: Not hard at all  Food Insecurity: No Food Insecurity (02/03/2024)   Received from [facility]   Hunger Vital Sign    Within the past 12 months, you worried that your food would run out before you got the money to buy more.: Never true    Within the past 12 months, the food you bought just didn't last and you didn't have money to get more.: Never true  Transportation Needs: No Transportation Needs (02/03/2024)   Received from [facility] - Transportation    Lack of Transportation (Medical): No    Lack of Transportation (Non-Medical): No  Physical  Activity: Not on file  Stress: Not on file  Social Connections: Unknown (04/07/2022)   Received from [facility]   Social Network    Social Network: Not on file  Intimate Partner Violence: Unknown (02/27/2022)   Received from [facility]   HITS    Physically Hurt: Not on file    Insult or Talk Down To: Not on file    Threaten Physical Harm: Not on file    Scream or Curse: Not on file    Review of Systems  Respiratory:  Positive for apnea. Negative for shortness of breath.   Musculoskeletal:  Positive for arthralgias and back pain.  Psychiatric/Behavioral:  Positive for sleep disturbance.     Vitals:   08/03/24 1107  BP: 113/67  Pulse: 75  SpO2: 96%     Physical Exam Constitutional:      Appearance: She is obese.  HENT:     Head: Normocephalic and atraumatic.     Mouth/Throat:     Mouth: Mucous membranes are moist.  Eyes:     General: No scleral icterus. Cardiovascular:     Rate and Rhythm: Normal rate and regular rhythm.     Pulses: Normal pulses.     Heart sounds: Normal heart sounds. No murmur heard.    No friction rub.  Pulmonary:     Effort: Pulmonary effort is normal. No respiratory distress.     Breath sounds: Normal breath sounds. No stridor. No wheezing or rhonchi.  Musculoskeletal:     Cervical back: No rigidity or tenderness.  Skin:    Coloration: Skin is not jaundiced or pale.  Neurological:     Mental Status: She is alert.  Psychiatric:        Mood and Affect: Mood normal.    Data Reviewed: Records from [facility] reviewed on care everywhere  Most recent CT scan reviewed  Assessment:  Mild obstructive sleep apnea - Unable to tolerate CPAP therapy - Continue to monitor symptoms  Not nodules - Stable from previous - Ground glass changes did resolve on the last CT - We had agreed on following up CT about 18 months to 2 years  Chronic insomnia - Continues to tolerate [name]    Plan/Recommendations:  Follow-up in 6 months-appointment  should be after CT obtained  Repeat CT in 6 months from here  Continue [name]   Encouraged to call with significant concerns  Encouraged sleep call with any concerning symptoms   Jennet Epley, MD [place] [facility] Pager: See Tracey

## 2024-08-03 NOTE — Patient Instructions (Addendum)
 CT scan of the chest without contrast in 6 months-your last CAT scan was 08/12/2023  Prescription for [name]  will be sent to pharmacy  I will see you after the scan-follow-up appointment in 6 months  Call us  with significant concerns

## 2024-08-04 DIAGNOSIS — D7219 Other eosinophilia: Secondary | ICD-10-CM | POA: Diagnosis not present

## 2024-08-04 DIAGNOSIS — R911 Solitary pulmonary nodule: Secondary | ICD-10-CM | POA: Diagnosis not present

## 2024-08-04 DIAGNOSIS — D72820 Lymphocytosis (symptomatic): Secondary | ICD-10-CM | POA: Diagnosis not present

## 2024-08-04 DIAGNOSIS — D751 Secondary polycythemia: Secondary | ICD-10-CM | POA: Diagnosis not present

## 2024-08-04 DIAGNOSIS — R3121 Asymptomatic microscopic hematuria: Secondary | ICD-10-CM | POA: Diagnosis not present

## 2024-08-04 DIAGNOSIS — E669 Obesity, unspecified: Secondary | ICD-10-CM | POA: Diagnosis not present

## 2025-01-31 ENCOUNTER — Other Ambulatory Visit

## 2025-03-22 ENCOUNTER — Ambulatory Visit: Admitting: Pulmonary Disease
